# Patient Record
Sex: Female | Born: 1940 | Race: White | Hispanic: No | Marital: Married | Smoking: Never smoker
Health system: Southern US, Community
[De-identification: ages and names within clinical notes are randomized; demographics above are authoritative.]

## PROBLEM LIST (undated history)

## (undated) DIAGNOSIS — E785 Hyperlipidemia, unspecified: Secondary | ICD-10-CM

## (undated) DIAGNOSIS — M810 Age-related osteoporosis without current pathological fracture: Secondary | ICD-10-CM

## (undated) DIAGNOSIS — B449 Aspergillosis, unspecified: Secondary | ICD-10-CM

## (undated) HISTORY — DX: Age-related osteoporosis without current pathological fracture: M81.0

## (undated) HISTORY — DX: Aspergillosis, unspecified: B44.9

## (undated) HISTORY — DX: Hyperlipidemia, unspecified: E78.5

---

## 1999-07-08 ENCOUNTER — Encounter: Admission: RE | Admit: 1999-07-08 | Discharge: 1999-07-08 | Payer: Self-pay | Admitting: Internal Medicine

## 1999-07-08 ENCOUNTER — Encounter: Payer: Self-pay | Admitting: Internal Medicine

## 1999-11-01 ENCOUNTER — Other Ambulatory Visit: Admission: RE | Admit: 1999-11-01 | Discharge: 1999-11-01 | Payer: Self-pay | Admitting: Internal Medicine

## 1999-12-07 ENCOUNTER — Encounter: Admission: RE | Admit: 1999-12-07 | Discharge: 1999-12-07 | Payer: Self-pay | Admitting: Internal Medicine

## 1999-12-07 ENCOUNTER — Encounter: Payer: Self-pay | Admitting: Internal Medicine

## 2000-07-11 ENCOUNTER — Encounter: Admission: RE | Admit: 2000-07-11 | Discharge: 2000-07-11 | Payer: Self-pay | Admitting: Internal Medicine

## 2000-07-11 ENCOUNTER — Encounter: Payer: Self-pay | Admitting: Internal Medicine

## 2000-09-07 ENCOUNTER — Encounter: Admission: RE | Admit: 2000-09-07 | Discharge: 2000-09-07 | Payer: Self-pay | Admitting: Internal Medicine

## 2000-09-07 ENCOUNTER — Encounter: Payer: Self-pay | Admitting: Internal Medicine

## 2000-09-14 ENCOUNTER — Ambulatory Visit: Admission: RE | Admit: 2000-09-14 | Discharge: 2000-09-14 | Payer: Self-pay | Admitting: Internal Medicine

## 2000-09-14 ENCOUNTER — Encounter (INDEPENDENT_AMBULATORY_CARE_PROVIDER_SITE_OTHER): Payer: Self-pay

## 2001-08-21 ENCOUNTER — Encounter: Payer: Self-pay | Admitting: Internal Medicine

## 2001-08-21 ENCOUNTER — Encounter: Admission: RE | Admit: 2001-08-21 | Discharge: 2001-08-21 | Payer: Self-pay | Admitting: Internal Medicine

## 2002-09-23 ENCOUNTER — Encounter: Admission: RE | Admit: 2002-09-23 | Discharge: 2002-09-23 | Payer: Self-pay | Admitting: Internal Medicine

## 2002-09-23 ENCOUNTER — Encounter: Payer: Self-pay | Admitting: Internal Medicine

## 2003-11-05 ENCOUNTER — Encounter: Admission: RE | Admit: 2003-11-05 | Discharge: 2003-11-05 | Payer: Self-pay | Admitting: Internal Medicine

## 2004-01-26 ENCOUNTER — Other Ambulatory Visit: Admission: RE | Admit: 2004-01-26 | Discharge: 2004-01-26 | Payer: Self-pay | Admitting: Internal Medicine

## 2004-11-18 ENCOUNTER — Encounter: Admission: RE | Admit: 2004-11-18 | Discharge: 2004-11-18 | Payer: Self-pay | Admitting: Internal Medicine

## 2005-12-22 ENCOUNTER — Encounter: Admission: RE | Admit: 2005-12-22 | Discharge: 2005-12-22 | Payer: Self-pay | Admitting: Internal Medicine

## 2006-04-06 ENCOUNTER — Other Ambulatory Visit: Admission: RE | Admit: 2006-04-06 | Discharge: 2006-04-06 | Payer: Self-pay | Admitting: Internal Medicine

## 2007-01-10 ENCOUNTER — Encounter: Admission: RE | Admit: 2007-01-10 | Discharge: 2007-01-10 | Payer: Self-pay | Admitting: Internal Medicine

## 2007-03-20 ENCOUNTER — Ambulatory Visit: Payer: Self-pay | Admitting: Internal Medicine

## 2007-05-14 ENCOUNTER — Ambulatory Visit: Payer: Self-pay | Admitting: Internal Medicine

## 2008-02-14 ENCOUNTER — Encounter: Admission: RE | Admit: 2008-02-14 | Discharge: 2008-02-14 | Payer: Self-pay | Admitting: Internal Medicine

## 2008-07-28 ENCOUNTER — Other Ambulatory Visit: Admission: RE | Admit: 2008-07-28 | Discharge: 2008-07-28 | Payer: Self-pay | Admitting: Internal Medicine

## 2009-02-25 ENCOUNTER — Encounter: Admission: RE | Admit: 2009-02-25 | Discharge: 2009-02-25 | Payer: Self-pay | Admitting: Internal Medicine

## 2010-03-24 ENCOUNTER — Encounter: Admission: RE | Admit: 2010-03-24 | Discharge: 2010-03-24 | Payer: Self-pay | Admitting: Internal Medicine

## 2011-02-01 NOTE — Assessment & Plan Note (Signed)
[facility]                             PULMONARY OFFICE NOTE   Sarah Odonnell, Sarah Odonnell                       MRN:          [id number]  DATE:03/20/2007                            DOB:          1941/05/06    REASON FOR CONSULTATION:  Asthma.   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  Her previous records from 2001 were not available  for review today, but she is doing great since that time on Pulmicort  dosed one puff b.i.d. with no significant chronic cough or need for  rescue inhaler.  However, approximately two weeks ago, she began having  increasing cough with thick mucous production which was slightly yellow.  She received antibiotics with no benefit and a Cortizone shot that  seemed to help more than anything else.  It feels partially better,  but still having difficulty with thick mucous plugs, especially in the  evenings.  She denies any nocturnal exacerbation or early morning  exacerbation of cough or wheezing, fevers, chills, sweats, dyspnea or  leg swelling or chest pain of any kind.   PAST MEDICAL HISTORY:  Significant only for asthma.   ALLERGIES:  None known.   MEDICATIONS:  1. Pulmicort one puff b.i.d.  2. Mucinex p.r.n., never more than twice daily.   SOCIAL HISTORY:  She has never smoked, and is retired.   FAMILY HISTORY:  Recorded in detail, significant for allergies  everyone.   REVIEW OF SYSTEMS:  Taken in detail in the worksheet, negative except as  outlined above.   PHYSICAL EXAMINATION:  GENERAL:  This is a delightful, thin, ambulatory  white female in no acute distress.  VITAL SIGNS:  Stable.  HEENT:  Unremarkable.  Pharynx is clear.  NECK:  Supple without cervical adenopathy or tenderness.  Trachea is  midline.  LUNGS:   Lung fields revealed completely clear bilaterally to  auscultation and percussion with no wheezing on expiration.  HEART:  Regular rhythm without murmurs, gallops, rubs.  ABDOMEN:  Soft, benign.  EXTREMITIES:  Warm without calf tenderness, cyanosis, clubbing or edema.   STUDIES:  Chest x-ray is pending.   IMPRESSION:  Persistent cough following what amounts to an apparent URI  with an asthma exacerbation.  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   The old thinking on ABPA is that it is a steroid dependent disease,  but in the era of very potent and effective inhaled steroids, I do not  necessarily feel these type of patients need to be prolonged systemic  steroids, but she would benefit from a more aggressive contingency plan.  In the event of exacerbations, the benefit of Pulmicort is that she does  not need to change inhalers but simply can increase the Pulmicort up to  four puffs b.i.d. at the fist sign of any exacerbation and then taper  back down using the squirt gun could put out a fire analogy, as long as  it is used early and aimed correctly.     Charlaine Dalton. Sherene Sires, MD, [facility]  Electronically Signed    MBW/MedQ  DD: 03/20/2007  DT: 03/21/2007  Job #: [id number]   cc:   Theressa Millard, M.D.

## 2011-02-01 NOTE — Assessment & Plan Note (Signed)
[facility]                             PULMONARY OFFICE NOTE   NAME:Sarah Odonnell, Sarah Odonnell                       MRN:          [id number]  DATE:05/14/2007                            DOB:          02/07/1941    HISTORY:  Delightful 70 year old white female, never smoker, with  working diagnosis of ABPA made in 2001 and doing great until she worked  in the yard too much this summer and came in with an acute cough on  March 20, 2007 which seemed to respond very nicely to a short course of  prednisone and a boost of Pulmicort up to 4 puffs b.i.d.  She is back  down to 1 puff b.i.d. now with no symptoms of cough or dyspnea at all  but has not gone back out in the yard.   PHYSICAL EXAMINATION:  GENERAL:  She is a pleasant ambulatory white  female in no acute distress.  VITAL SIGNS:  Stable.  LUNGS:  Lung fields are perfectly clear bilaterally to auscultation and  percussion.  CARDIAC:  Regular rate and rhythm without murmurs, gallop, or rub or  increase in P2.  ABDOMEN:  Soft, benign.  EXTREMITIES:  Without calf tenderness, cyanosis, clubbing.   Chest x-ray reviewed from March 20, 2007 is normal.  PFTs were performed  today and are also normal.   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  I strongly suspect the reason for this is that Pulmicort, when  used consistently as she does, is giving her all the effect on the  airways as low-doses of prednisone would and therefore recommend that  she stay on a floor of 1 puff b.i.d. indefinitely but have a very low  threshold to increase to 4 puffs b.i.d. regardless of the mechanism of  any flare up of coughing or dyspnea.   She agreed to this, and I will be happy to see her back here on a p.r.n.  but do not believe any further regular  pulmonary followup is needed.     Charlaine Dalton. Sherene Sires, MD, [facility]  Electronically Signed    MBW/MedQ  DD: 05/14/2007  DT: 05/15/2007  Job #: [id number]   cc:   Theressa Millard, M.D.

## 2011-02-04 NOTE — Procedures (Signed)
[facility]  Patient:    Sarah Odonnell, Sarah Odonnell                      MRN: [id number] Proc. Date: 09/14/00 Attending:  Charlaine Dalton. Sherene Sires, M.D. [facility]                           Procedure Report  PROCEDURE:  Bronchoscopy with endobronchial biopsy of the right upper lobe.  INDICATIONS:  This is a very nice 70 year old white female, with persistent cough since August 2001 and evidence of right upper lobe atelectasis on recent workup by Dr. Earl Gala.  Bronchoscopy was felt to be indicated to explore the orifice of the right upper lobe to see if there was a clear etiology for right upper lobe atelectasis, such as right upper lobe obstruction with tumor or foreign body.  For a full consultation note, please see the dictated records from the office of September 11, 2000.  The patient agrees to the procedure after a full discussion of the risks, benefits and alternatives at that time.  The procedure was performed in the bronchoscopy suite, with continuous monitoring by surface ECG and oximetry.  The patient maintained greater than 90% saturation throughout the procedure, and sinus rhythm.  She required a total of 50 mg IV Demerol and 5 mg of IV Versed for adequate sedation.  DESCRIPTION OF PROCEDURE AND FINDINGS:  The right nares and oropharynx were infiltrated with 10% lidocaine spray.  The right nares then prepared with 2% lidocaine jelly.  Using a standard flexible fiberoptic bronchoscope, the right nares was easily cannulated; good visualization of the entire oropharynx and larynx.  The cords moved normally and there were no apparent upper airway lesions.  Using additional 1% lidocaine, the entire tracheobronchial tubes were explored bilaterally, with these findings: 1. The trachea, carina and all the left-sided airways were normal. 2. The right-sided airways were normal below the takeoff of the right upper    lobe. 3. The right upper lobe was markedly abnormal as  follows:    a. There was tenacious, ropey mucus obstructing the right upper lobe, with       apparent concentric narrowing of the apical and posterior segments.  I       could not appreciate a definite endobronchial mass.  However, because       of the tenacious nature of the secretions I was not able to completely       lavage the upper lobe free of debris.  I did biopsy the orifice of the       right upper lobe four times, with adequate tissue obtained.  The       anterior segment of the right upper lobe appeared to open widely.    b. In addition, the right upper lobe was vigorously lavaged.  ____________________________ DD:  09/14/00 TD:  09/14/00 Job: [id number]

## 2011-03-29 ENCOUNTER — Other Ambulatory Visit: Payer: Self-pay | Admitting: Internal Medicine

## 2011-03-29 DIAGNOSIS — Z1231 Encounter for screening mammogram for malignant neoplasm of breast: Secondary | ICD-10-CM

## 2011-04-20 ENCOUNTER — Ambulatory Visit: Payer: Self-pay

## 2011-04-27 ENCOUNTER — Ambulatory Visit
Admission: RE | Admit: 2011-04-27 | Discharge: 2011-04-27 | Disposition: A | Discharge status: Home / Self Care | Payer: Medicare Other | Source: Ambulatory Visit | Attending: Internal Medicine | Admitting: Internal Medicine

## 2011-04-27 DIAGNOSIS — Z1231 Encounter for screening mammogram for malignant neoplasm of breast: Secondary | ICD-10-CM

## 2012-05-30 ENCOUNTER — Other Ambulatory Visit: Payer: Self-pay | Admitting: Internal Medicine

## 2012-05-30 DIAGNOSIS — Z1231 Encounter for screening mammogram for malignant neoplasm of breast: Secondary | ICD-10-CM

## 2012-07-25 ENCOUNTER — Ambulatory Visit
Admission: RE | Admit: 2012-07-25 | Discharge: 2012-07-25 | Disposition: A | Discharge status: Home / Self Care | Payer: Medicare Other | Source: Ambulatory Visit | Attending: Internal Medicine | Admitting: Internal Medicine

## 2012-07-25 DIAGNOSIS — Z1231 Encounter for screening mammogram for malignant neoplasm of breast: Secondary | ICD-10-CM

## 2013-03-19 ENCOUNTER — Other Ambulatory Visit: Payer: Self-pay | Admitting: Dermatology

## 2013-08-12 ENCOUNTER — Other Ambulatory Visit: Payer: Self-pay

## 2013-08-12 DIAGNOSIS — Z1231 Encounter for screening mammogram for malignant neoplasm of breast: Secondary | ICD-10-CM

## 2013-08-21 ENCOUNTER — Ambulatory Visit: Payer: Medicare Other

## 2013-10-01 ENCOUNTER — Ambulatory Visit: Payer: Medicare Other

## 2013-10-07 ENCOUNTER — Ambulatory Visit
Admission: RE | Admit: 2013-10-07 | Discharge: 2013-10-07 | Disposition: A | Discharge status: Home / Self Care | Payer: Medicare HMO | Source: Ambulatory Visit

## 2013-10-07 DIAGNOSIS — Z1231 Encounter for screening mammogram for malignant neoplasm of breast: Secondary | ICD-10-CM

## 2014-02-06 ENCOUNTER — Other Ambulatory Visit: Payer: Self-pay | Admitting: Internal Medicine

## 2014-02-06 DIAGNOSIS — M25469 Effusion, unspecified knee: Secondary | ICD-10-CM

## 2014-02-12 ENCOUNTER — Ambulatory Visit
Admission: RE | Admit: 2014-02-12 | Discharge: 2014-02-12 | Disposition: A | Discharge status: Home / Self Care | Payer: Medicare HMO | Source: Ambulatory Visit | Attending: Internal Medicine | Admitting: Internal Medicine

## 2014-02-12 DIAGNOSIS — M25469 Effusion, unspecified knee: Secondary | ICD-10-CM

## 2014-10-30 ENCOUNTER — Other Ambulatory Visit: Payer: Self-pay

## 2014-10-30 DIAGNOSIS — Z1231 Encounter for screening mammogram for malignant neoplasm of breast: Secondary | ICD-10-CM

## 2014-11-19 ENCOUNTER — Ambulatory Visit
Admission: RE | Admit: 2014-11-19 | Discharge: 2014-11-19 | Disposition: A | Discharge status: Home / Self Care | Payer: Medicare Other | Source: Ambulatory Visit

## 2014-11-19 DIAGNOSIS — Z1231 Encounter for screening mammogram for malignant neoplasm of breast: Secondary | ICD-10-CM

## 2015-05-28 ENCOUNTER — Other Ambulatory Visit: Payer: Self-pay | Admitting: Physician Assistant

## 2015-11-20 ENCOUNTER — Other Ambulatory Visit: Payer: Self-pay

## 2015-11-20 DIAGNOSIS — Z1231 Encounter for screening mammogram for malignant neoplasm of breast: Secondary | ICD-10-CM

## 2015-12-02 ENCOUNTER — Ambulatory Visit
Admission: RE | Admit: 2015-12-02 | Discharge: 2015-12-02 | Disposition: A | Discharge status: Home / Self Care | Payer: Medicare HMO | Source: Ambulatory Visit

## 2015-12-02 DIAGNOSIS — Z1231 Encounter for screening mammogram for malignant neoplasm of breast: Secondary | ICD-10-CM

## 2016-09-19 HISTORY — PX: CATARACT EXTRACTION: SUR2

## 2016-10-21 DIAGNOSIS — M67912 Unspecified disorder of synovium and tendon, left shoulder: Secondary | ICD-10-CM | POA: Diagnosis not present

## 2016-10-27 DIAGNOSIS — M25512 Pain in left shoulder: Secondary | ICD-10-CM | POA: Diagnosis not present

## 2016-11-01 DIAGNOSIS — M25512 Pain in left shoulder: Secondary | ICD-10-CM | POA: Diagnosis not present

## 2016-11-03 DIAGNOSIS — M25512 Pain in left shoulder: Secondary | ICD-10-CM | POA: Diagnosis not present

## 2016-11-08 DIAGNOSIS — M25512 Pain in left shoulder: Secondary | ICD-10-CM | POA: Diagnosis not present

## 2016-11-09 DIAGNOSIS — H2511 Age-related nuclear cataract, right eye: Secondary | ICD-10-CM | POA: Diagnosis not present

## 2016-11-09 DIAGNOSIS — H5203 Hypermetropia, bilateral: Secondary | ICD-10-CM | POA: Diagnosis not present

## 2016-11-09 DIAGNOSIS — H25012 Cortical age-related cataract, left eye: Secondary | ICD-10-CM | POA: Diagnosis not present

## 2016-11-10 DIAGNOSIS — M25512 Pain in left shoulder: Secondary | ICD-10-CM | POA: Diagnosis not present

## 2016-11-15 DIAGNOSIS — M25512 Pain in left shoulder: Secondary | ICD-10-CM | POA: Diagnosis not present

## 2016-11-16 DIAGNOSIS — M75122 Complete rotator cuff tear or rupture of left shoulder, not specified as traumatic: Secondary | ICD-10-CM | POA: Diagnosis not present

## 2016-11-16 DIAGNOSIS — M25512 Pain in left shoulder: Secondary | ICD-10-CM | POA: Diagnosis not present

## 2016-11-17 DIAGNOSIS — M25512 Pain in left shoulder: Secondary | ICD-10-CM | POA: Diagnosis not present

## 2016-11-22 DIAGNOSIS — M25512 Pain in left shoulder: Secondary | ICD-10-CM | POA: Diagnosis not present

## 2016-11-25 DIAGNOSIS — M75122 Complete rotator cuff tear or rupture of left shoulder, not specified as traumatic: Secondary | ICD-10-CM | POA: Diagnosis not present

## 2016-11-25 DIAGNOSIS — M25512 Pain in left shoulder: Secondary | ICD-10-CM | POA: Diagnosis not present

## 2016-12-26 DIAGNOSIS — M7542 Impingement syndrome of left shoulder: Secondary | ICD-10-CM | POA: Diagnosis not present

## 2016-12-26 DIAGNOSIS — M94212 Chondromalacia, left shoulder: Secondary | ICD-10-CM | POA: Diagnosis not present

## 2016-12-26 DIAGNOSIS — M19012 Primary osteoarthritis, left shoulder: Secondary | ICD-10-CM | POA: Diagnosis not present

## 2016-12-26 DIAGNOSIS — M75112 Incomplete rotator cuff tear or rupture of left shoulder, not specified as traumatic: Secondary | ICD-10-CM | POA: Diagnosis not present

## 2016-12-26 DIAGNOSIS — G8918 Other acute postprocedural pain: Secondary | ICD-10-CM | POA: Diagnosis not present

## 2016-12-26 DIAGNOSIS — M7502 Adhesive capsulitis of left shoulder: Secondary | ICD-10-CM | POA: Diagnosis not present

## 2016-12-28 DIAGNOSIS — M25512 Pain in left shoulder: Secondary | ICD-10-CM | POA: Diagnosis not present

## 2016-12-28 DIAGNOSIS — Z9889 Other specified postprocedural states: Secondary | ICD-10-CM | POA: Diagnosis not present

## 2016-12-28 DIAGNOSIS — M25612 Stiffness of left shoulder, not elsewhere classified: Secondary | ICD-10-CM | POA: Diagnosis not present

## 2016-12-30 DIAGNOSIS — M25612 Stiffness of left shoulder, not elsewhere classified: Secondary | ICD-10-CM | POA: Diagnosis not present

## 2016-12-30 DIAGNOSIS — Z9889 Other specified postprocedural states: Secondary | ICD-10-CM | POA: Diagnosis not present

## 2016-12-30 DIAGNOSIS — M25512 Pain in left shoulder: Secondary | ICD-10-CM | POA: Diagnosis not present

## 2017-01-04 DIAGNOSIS — M25612 Stiffness of left shoulder, not elsewhere classified: Secondary | ICD-10-CM | POA: Diagnosis not present

## 2017-01-04 DIAGNOSIS — Z0000 Encounter for general adult medical examination without abnormal findings: Secondary | ICD-10-CM | POA: Diagnosis not present

## 2017-01-04 DIAGNOSIS — M25512 Pain in left shoulder: Secondary | ICD-10-CM | POA: Diagnosis not present

## 2017-01-04 DIAGNOSIS — Z9889 Other specified postprocedural states: Secondary | ICD-10-CM | POA: Diagnosis not present

## 2017-01-04 DIAGNOSIS — M810 Age-related osteoporosis without current pathological fracture: Secondary | ICD-10-CM | POA: Diagnosis not present

## 2017-01-05 DIAGNOSIS — Z9889 Other specified postprocedural states: Secondary | ICD-10-CM | POA: Diagnosis not present

## 2017-01-05 DIAGNOSIS — M25612 Stiffness of left shoulder, not elsewhere classified: Secondary | ICD-10-CM | POA: Diagnosis not present

## 2017-01-05 DIAGNOSIS — R946 Abnormal results of thyroid function studies: Secondary | ICD-10-CM | POA: Diagnosis not present

## 2017-01-05 DIAGNOSIS — M25512 Pain in left shoulder: Secondary | ICD-10-CM | POA: Diagnosis not present

## 2017-01-09 DIAGNOSIS — M25612 Stiffness of left shoulder, not elsewhere classified: Secondary | ICD-10-CM | POA: Diagnosis not present

## 2017-01-09 DIAGNOSIS — M25512 Pain in left shoulder: Secondary | ICD-10-CM | POA: Diagnosis not present

## 2017-01-09 DIAGNOSIS — Z9889 Other specified postprocedural states: Secondary | ICD-10-CM | POA: Diagnosis not present

## 2017-01-11 DIAGNOSIS — M810 Age-related osteoporosis without current pathological fracture: Secondary | ICD-10-CM | POA: Diagnosis not present

## 2017-01-11 DIAGNOSIS — Z1389 Encounter for screening for other disorder: Secondary | ICD-10-CM | POA: Diagnosis not present

## 2017-01-11 DIAGNOSIS — Z681 Body mass index (BMI) 19 or less, adult: Secondary | ICD-10-CM | POA: Diagnosis not present

## 2017-01-11 DIAGNOSIS — Z0000 Encounter for general adult medical examination without abnormal findings: Secondary | ICD-10-CM | POA: Diagnosis not present

## 2017-01-12 DIAGNOSIS — M25512 Pain in left shoulder: Secondary | ICD-10-CM | POA: Diagnosis not present

## 2017-01-12 DIAGNOSIS — Z9889 Other specified postprocedural states: Secondary | ICD-10-CM | POA: Diagnosis not present

## 2017-01-12 DIAGNOSIS — M25612 Stiffness of left shoulder, not elsewhere classified: Secondary | ICD-10-CM | POA: Diagnosis not present

## 2017-01-16 DIAGNOSIS — M25512 Pain in left shoulder: Secondary | ICD-10-CM | POA: Diagnosis not present

## 2017-01-16 DIAGNOSIS — M25612 Stiffness of left shoulder, not elsewhere classified: Secondary | ICD-10-CM | POA: Diagnosis not present

## 2017-01-16 DIAGNOSIS — Z9889 Other specified postprocedural states: Secondary | ICD-10-CM | POA: Diagnosis not present

## 2017-01-19 DIAGNOSIS — Z9889 Other specified postprocedural states: Secondary | ICD-10-CM | POA: Diagnosis not present

## 2017-01-19 DIAGNOSIS — M25612 Stiffness of left shoulder, not elsewhere classified: Secondary | ICD-10-CM | POA: Diagnosis not present

## 2017-01-19 DIAGNOSIS — M25512 Pain in left shoulder: Secondary | ICD-10-CM | POA: Diagnosis not present

## 2017-01-20 DIAGNOSIS — Z9889 Other specified postprocedural states: Secondary | ICD-10-CM | POA: Diagnosis not present

## 2017-01-27 DIAGNOSIS — Z9889 Other specified postprocedural states: Secondary | ICD-10-CM | POA: Diagnosis not present

## 2017-01-27 DIAGNOSIS — M25612 Stiffness of left shoulder, not elsewhere classified: Secondary | ICD-10-CM | POA: Diagnosis not present

## 2017-01-27 DIAGNOSIS — M25512 Pain in left shoulder: Secondary | ICD-10-CM | POA: Diagnosis not present

## 2017-01-31 DIAGNOSIS — M25512 Pain in left shoulder: Secondary | ICD-10-CM | POA: Diagnosis not present

## 2017-01-31 DIAGNOSIS — Z9889 Other specified postprocedural states: Secondary | ICD-10-CM | POA: Diagnosis not present

## 2017-01-31 DIAGNOSIS — M25612 Stiffness of left shoulder, not elsewhere classified: Secondary | ICD-10-CM | POA: Diagnosis not present

## 2017-02-02 DIAGNOSIS — M25612 Stiffness of left shoulder, not elsewhere classified: Secondary | ICD-10-CM | POA: Diagnosis not present

## 2017-02-02 DIAGNOSIS — Z9889 Other specified postprocedural states: Secondary | ICD-10-CM | POA: Diagnosis not present

## 2017-02-02 DIAGNOSIS — M25512 Pain in left shoulder: Secondary | ICD-10-CM | POA: Diagnosis not present

## 2017-02-07 DIAGNOSIS — Z9889 Other specified postprocedural states: Secondary | ICD-10-CM | POA: Diagnosis not present

## 2017-02-07 DIAGNOSIS — M25512 Pain in left shoulder: Secondary | ICD-10-CM | POA: Diagnosis not present

## 2017-02-07 DIAGNOSIS — M25612 Stiffness of left shoulder, not elsewhere classified: Secondary | ICD-10-CM | POA: Diagnosis not present

## 2017-02-09 DIAGNOSIS — Z9889 Other specified postprocedural states: Secondary | ICD-10-CM | POA: Diagnosis not present

## 2017-02-09 DIAGNOSIS — M25512 Pain in left shoulder: Secondary | ICD-10-CM | POA: Diagnosis not present

## 2017-02-09 DIAGNOSIS — M25612 Stiffness of left shoulder, not elsewhere classified: Secondary | ICD-10-CM | POA: Diagnosis not present

## 2017-02-15 DIAGNOSIS — M25612 Stiffness of left shoulder, not elsewhere classified: Secondary | ICD-10-CM | POA: Diagnosis not present

## 2017-02-15 DIAGNOSIS — M25512 Pain in left shoulder: Secondary | ICD-10-CM | POA: Diagnosis not present

## 2017-02-15 DIAGNOSIS — Z9889 Other specified postprocedural states: Secondary | ICD-10-CM | POA: Diagnosis not present

## 2017-02-17 DIAGNOSIS — M25512 Pain in left shoulder: Secondary | ICD-10-CM | POA: Diagnosis not present

## 2017-02-17 DIAGNOSIS — Z9889 Other specified postprocedural states: Secondary | ICD-10-CM | POA: Diagnosis not present

## 2017-02-17 DIAGNOSIS — M25612 Stiffness of left shoulder, not elsewhere classified: Secondary | ICD-10-CM | POA: Diagnosis not present

## 2017-03-02 DIAGNOSIS — Z23 Encounter for immunization: Secondary | ICD-10-CM | POA: Diagnosis not present

## 2017-03-03 DIAGNOSIS — Z9889 Other specified postprocedural states: Secondary | ICD-10-CM | POA: Diagnosis not present

## 2017-03-08 DIAGNOSIS — M19072 Primary osteoarthritis, left ankle and foot: Secondary | ICD-10-CM | POA: Diagnosis not present

## 2017-03-23 DIAGNOSIS — H25012 Cortical age-related cataract, left eye: Secondary | ICD-10-CM | POA: Diagnosis not present

## 2017-03-23 DIAGNOSIS — H2513 Age-related nuclear cataract, bilateral: Secondary | ICD-10-CM | POA: Diagnosis not present

## 2017-04-11 DIAGNOSIS — H25811 Combined forms of age-related cataract, right eye: Secondary | ICD-10-CM | POA: Diagnosis not present

## 2017-04-11 DIAGNOSIS — H2511 Age-related nuclear cataract, right eye: Secondary | ICD-10-CM | POA: Diagnosis not present

## 2017-05-23 DIAGNOSIS — H25012 Cortical age-related cataract, left eye: Secondary | ICD-10-CM | POA: Diagnosis not present

## 2017-05-23 DIAGNOSIS — H2512 Age-related nuclear cataract, left eye: Secondary | ICD-10-CM | POA: Diagnosis not present

## 2017-05-23 DIAGNOSIS — H25812 Combined forms of age-related cataract, left eye: Secondary | ICD-10-CM | POA: Diagnosis not present

## 2017-08-03 ENCOUNTER — Other Ambulatory Visit: Payer: Self-pay | Admitting: Internal Medicine

## 2017-08-03 DIAGNOSIS — Z139 Encounter for screening, unspecified: Secondary | ICD-10-CM

## 2017-08-24 DIAGNOSIS — H04222 Epiphora due to insufficient drainage, left lacrimal gland: Secondary | ICD-10-CM | POA: Diagnosis not present

## 2017-08-24 DIAGNOSIS — H04221 Epiphora due to insufficient drainage, right lacrimal gland: Secondary | ICD-10-CM | POA: Diagnosis not present

## 2017-09-07 ENCOUNTER — Ambulatory Visit
Admission: RE | Admit: 2017-09-07 | Discharge: 2017-09-07 | Disposition: A | Discharge status: Home / Self Care | Payer: PPO | Source: Ambulatory Visit | Attending: Internal Medicine | Admitting: Internal Medicine

## 2017-09-07 DIAGNOSIS — H04223 Epiphora due to insufficient drainage, bilateral lacrimal glands: Secondary | ICD-10-CM | POA: Diagnosis not present

## 2017-09-07 DIAGNOSIS — H04552 Acquired stenosis of left nasolacrimal duct: Secondary | ICD-10-CM | POA: Diagnosis not present

## 2017-09-07 DIAGNOSIS — Z139 Encounter for screening, unspecified: Secondary | ICD-10-CM

## 2017-09-07 DIAGNOSIS — H04411 Chronic dacryocystitis of right lacrimal passage: Secondary | ICD-10-CM | POA: Diagnosis not present

## 2017-09-07 DIAGNOSIS — H04221 Epiphora due to insufficient drainage, right lacrimal gland: Secondary | ICD-10-CM | POA: Diagnosis not present

## 2017-09-07 DIAGNOSIS — H04412 Chronic dacryocystitis of left lacrimal passage: Secondary | ICD-10-CM | POA: Diagnosis not present

## 2017-09-07 DIAGNOSIS — Z1231 Encounter for screening mammogram for malignant neoplasm of breast: Secondary | ICD-10-CM | POA: Diagnosis not present

## 2017-09-07 DIAGNOSIS — H04123 Dry eye syndrome of bilateral lacrimal glands: Secondary | ICD-10-CM | POA: Diagnosis not present

## 2017-09-07 DIAGNOSIS — H04222 Epiphora due to insufficient drainage, left lacrimal gland: Secondary | ICD-10-CM | POA: Diagnosis not present

## 2017-09-07 DIAGNOSIS — H02132 Senile ectropion of right lower eyelid: Secondary | ICD-10-CM | POA: Diagnosis not present

## 2017-09-07 DIAGNOSIS — H02135 Senile ectropion of left lower eyelid: Secondary | ICD-10-CM | POA: Diagnosis not present

## 2017-09-07 DIAGNOSIS — H04551 Acquired stenosis of right nasolacrimal duct: Secondary | ICD-10-CM | POA: Diagnosis not present

## 2017-09-21 DIAGNOSIS — H04123 Dry eye syndrome of bilateral lacrimal glands: Secondary | ICD-10-CM | POA: Diagnosis not present

## 2017-09-21 DIAGNOSIS — H04552 Acquired stenosis of left nasolacrimal duct: Secondary | ICD-10-CM | POA: Diagnosis not present

## 2017-09-21 DIAGNOSIS — H02132 Senile ectropion of right lower eyelid: Secondary | ICD-10-CM | POA: Diagnosis not present

## 2017-09-21 DIAGNOSIS — H04223 Epiphora due to insufficient drainage, bilateral lacrimal glands: Secondary | ICD-10-CM | POA: Diagnosis not present

## 2017-09-21 DIAGNOSIS — H04551 Acquired stenosis of right nasolacrimal duct: Secondary | ICD-10-CM | POA: Diagnosis not present

## 2017-09-21 DIAGNOSIS — H04221 Epiphora due to insufficient drainage, right lacrimal gland: Secondary | ICD-10-CM | POA: Diagnosis not present

## 2017-09-21 DIAGNOSIS — H02135 Senile ectropion of left lower eyelid: Secondary | ICD-10-CM | POA: Diagnosis not present

## 2017-09-21 DIAGNOSIS — H00029 Hordeolum internum unspecified eye, unspecified eyelid: Secondary | ICD-10-CM | POA: Diagnosis not present

## 2017-09-21 DIAGNOSIS — H04222 Epiphora due to insufficient drainage, left lacrimal gland: Secondary | ICD-10-CM | POA: Diagnosis not present

## 2017-09-29 DIAGNOSIS — M2022 Hallux rigidus, left foot: Secondary | ICD-10-CM | POA: Diagnosis not present

## 2017-09-29 DIAGNOSIS — M79672 Pain in left foot: Secondary | ICD-10-CM | POA: Diagnosis not present

## 2017-10-05 DIAGNOSIS — H04222 Epiphora due to insufficient drainage, left lacrimal gland: Secondary | ICD-10-CM | POA: Diagnosis not present

## 2018-01-09 DIAGNOSIS — R946 Abnormal results of thyroid function studies: Secondary | ICD-10-CM | POA: Diagnosis not present

## 2018-01-09 DIAGNOSIS — Z79899 Other long term (current) drug therapy: Secondary | ICD-10-CM | POA: Diagnosis not present

## 2018-01-09 DIAGNOSIS — M810 Age-related osteoporosis without current pathological fracture: Secondary | ICD-10-CM | POA: Diagnosis not present

## 2018-01-09 DIAGNOSIS — R82998 Other abnormal findings in urine: Secondary | ICD-10-CM | POA: Diagnosis not present

## 2018-01-15 DIAGNOSIS — Z1389 Encounter for screening for other disorder: Secondary | ICD-10-CM | POA: Diagnosis not present

## 2018-01-15 DIAGNOSIS — B4489 Other forms of aspergillosis: Secondary | ICD-10-CM | POA: Diagnosis not present

## 2018-01-15 DIAGNOSIS — M810 Age-related osteoporosis without current pathological fracture: Secondary | ICD-10-CM | POA: Diagnosis not present

## 2018-01-15 DIAGNOSIS — Z681 Body mass index (BMI) 19 or less, adult: Secondary | ICD-10-CM | POA: Diagnosis not present

## 2018-01-15 DIAGNOSIS — Z0000 Encounter for general adult medical examination without abnormal findings: Secondary | ICD-10-CM | POA: Diagnosis not present

## 2018-01-15 DIAGNOSIS — R030 Elevated blood-pressure reading, without diagnosis of hypertension: Secondary | ICD-10-CM | POA: Diagnosis not present

## 2018-01-15 DIAGNOSIS — E7849 Other hyperlipidemia: Secondary | ICD-10-CM | POA: Diagnosis not present

## 2018-01-19 DIAGNOSIS — Z1212 Encounter for screening for malignant neoplasm of rectum: Secondary | ICD-10-CM | POA: Diagnosis not present

## 2018-10-19 ENCOUNTER — Other Ambulatory Visit: Payer: Self-pay | Admitting: Internal Medicine

## 2018-10-19 DIAGNOSIS — Z1231 Encounter for screening mammogram for malignant neoplasm of breast: Secondary | ICD-10-CM

## 2018-11-15 ENCOUNTER — Ambulatory Visit
Admission: RE | Admit: 2018-11-15 | Discharge: 2018-11-15 | Disposition: A | Discharge status: Home / Self Care | Payer: PPO | Source: Ambulatory Visit | Attending: Internal Medicine | Admitting: Internal Medicine

## 2018-11-15 DIAGNOSIS — Z1231 Encounter for screening mammogram for malignant neoplasm of breast: Secondary | ICD-10-CM

## 2018-11-23 DIAGNOSIS — M67911 Unspecified disorder of synovium and tendon, right shoulder: Secondary | ICD-10-CM | POA: Diagnosis not present

## 2018-11-27 DIAGNOSIS — M7541 Impingement syndrome of right shoulder: Secondary | ICD-10-CM | POA: Diagnosis not present

## 2018-11-27 DIAGNOSIS — M6281 Muscle weakness (generalized): Secondary | ICD-10-CM | POA: Diagnosis not present

## 2018-11-28 DIAGNOSIS — M7541 Impingement syndrome of right shoulder: Secondary | ICD-10-CM | POA: Diagnosis not present

## 2018-11-28 DIAGNOSIS — M6281 Muscle weakness (generalized): Secondary | ICD-10-CM | POA: Diagnosis not present

## 2018-12-04 DIAGNOSIS — M7541 Impingement syndrome of right shoulder: Secondary | ICD-10-CM | POA: Diagnosis not present

## 2018-12-04 DIAGNOSIS — M6281 Muscle weakness (generalized): Secondary | ICD-10-CM | POA: Diagnosis not present

## 2018-12-11 DIAGNOSIS — M7541 Impingement syndrome of right shoulder: Secondary | ICD-10-CM | POA: Diagnosis not present

## 2018-12-11 DIAGNOSIS — M6281 Muscle weakness (generalized): Secondary | ICD-10-CM | POA: Diagnosis not present

## 2018-12-13 DIAGNOSIS — M6281 Muscle weakness (generalized): Secondary | ICD-10-CM | POA: Diagnosis not present

## 2018-12-13 DIAGNOSIS — M7541 Impingement syndrome of right shoulder: Secondary | ICD-10-CM | POA: Diagnosis not present

## 2018-12-18 DIAGNOSIS — M6281 Muscle weakness (generalized): Secondary | ICD-10-CM | POA: Diagnosis not present

## 2018-12-18 DIAGNOSIS — M7541 Impingement syndrome of right shoulder: Secondary | ICD-10-CM | POA: Diagnosis not present

## 2018-12-21 DIAGNOSIS — M6281 Muscle weakness (generalized): Secondary | ICD-10-CM | POA: Diagnosis not present

## 2019-01-01 DIAGNOSIS — M6281 Muscle weakness (generalized): Secondary | ICD-10-CM | POA: Diagnosis not present

## 2019-01-01 DIAGNOSIS — M7541 Impingement syndrome of right shoulder: Secondary | ICD-10-CM | POA: Diagnosis not present

## 2019-01-03 DIAGNOSIS — M6281 Muscle weakness (generalized): Secondary | ICD-10-CM | POA: Diagnosis not present

## 2019-01-03 DIAGNOSIS — M7541 Impingement syndrome of right shoulder: Secondary | ICD-10-CM | POA: Diagnosis not present

## 2019-01-07 DIAGNOSIS — M7541 Impingement syndrome of right shoulder: Secondary | ICD-10-CM | POA: Diagnosis not present

## 2019-01-07 DIAGNOSIS — M6281 Muscle weakness (generalized): Secondary | ICD-10-CM | POA: Diagnosis not present

## 2019-01-10 DIAGNOSIS — M810 Age-related osteoporosis without current pathological fracture: Secondary | ICD-10-CM | POA: Diagnosis not present

## 2019-01-10 DIAGNOSIS — E7849 Other hyperlipidemia: Secondary | ICD-10-CM | POA: Diagnosis not present

## 2019-01-10 DIAGNOSIS — R946 Abnormal results of thyroid function studies: Secondary | ICD-10-CM | POA: Diagnosis not present

## 2019-01-10 DIAGNOSIS — M7541 Impingement syndrome of right shoulder: Secondary | ICD-10-CM | POA: Diagnosis not present

## 2019-01-10 DIAGNOSIS — M6281 Muscle weakness (generalized): Secondary | ICD-10-CM | POA: Diagnosis not present

## 2019-01-15 DIAGNOSIS — M6281 Muscle weakness (generalized): Secondary | ICD-10-CM | POA: Diagnosis not present

## 2019-01-15 DIAGNOSIS — M7541 Impingement syndrome of right shoulder: Secondary | ICD-10-CM | POA: Diagnosis not present

## 2019-01-17 DIAGNOSIS — M7541 Impingement syndrome of right shoulder: Secondary | ICD-10-CM | POA: Diagnosis not present

## 2019-01-17 DIAGNOSIS — M810 Age-related osteoporosis without current pathological fracture: Secondary | ICD-10-CM | POA: Diagnosis not present

## 2019-01-17 DIAGNOSIS — M6281 Muscle weakness (generalized): Secondary | ICD-10-CM | POA: Diagnosis not present

## 2019-01-17 DIAGNOSIS — Z1331 Encounter for screening for depression: Secondary | ICD-10-CM | POA: Diagnosis not present

## 2019-01-17 DIAGNOSIS — E785 Hyperlipidemia, unspecified: Secondary | ICD-10-CM | POA: Diagnosis not present

## 2019-01-17 DIAGNOSIS — Z0000 Encounter for general adult medical examination without abnormal findings: Secondary | ICD-10-CM | POA: Diagnosis not present

## 2019-01-22 DIAGNOSIS — M6281 Muscle weakness (generalized): Secondary | ICD-10-CM | POA: Diagnosis not present

## 2019-01-22 DIAGNOSIS — M7541 Impingement syndrome of right shoulder: Secondary | ICD-10-CM | POA: Diagnosis not present

## 2019-01-24 DIAGNOSIS — M6281 Muscle weakness (generalized): Secondary | ICD-10-CM | POA: Diagnosis not present

## 2019-01-24 DIAGNOSIS — M7541 Impingement syndrome of right shoulder: Secondary | ICD-10-CM | POA: Diagnosis not present

## 2019-01-29 DIAGNOSIS — M7541 Impingement syndrome of right shoulder: Secondary | ICD-10-CM | POA: Diagnosis not present

## 2019-01-29 DIAGNOSIS — M6281 Muscle weakness (generalized): Secondary | ICD-10-CM | POA: Diagnosis not present

## 2019-01-31 DIAGNOSIS — M7541 Impingement syndrome of right shoulder: Secondary | ICD-10-CM | POA: Diagnosis not present

## 2019-01-31 DIAGNOSIS — M6281 Muscle weakness (generalized): Secondary | ICD-10-CM | POA: Diagnosis not present

## 2019-05-16 DIAGNOSIS — Z961 Presence of intraocular lens: Secondary | ICD-10-CM | POA: Diagnosis not present

## 2019-05-16 DIAGNOSIS — H04222 Epiphora due to insufficient drainage, left lacrimal gland: Secondary | ICD-10-CM | POA: Diagnosis not present

## 2019-10-11 ENCOUNTER — Ambulatory Visit: Payer: PPO | Attending: Internal Medicine

## 2019-10-11 DIAGNOSIS — Z23 Encounter for immunization: Secondary | ICD-10-CM | POA: Insufficient documentation

## 2019-10-11 NOTE — Progress Notes (Signed)
   Covid-19 Vaccination Clinic  Name:  Sarah Odonnell    MRN: [id number] DOB: Sep 17, 1941  10/11/2019  Ms. Sarah Odonnell was observed post Covid-19 immunization for 30 minutes based on pre-vaccination screening without incidence. She was provided with Vaccine Information Sheet and instruction to access the V-Safe system.   Ms. Sarah Odonnell was instructed to call 911 with any severe reactions post vaccine: Marland Kitchen Difficulty breathing  . Swelling of your face and throat  . A fast heartbeat  . A bad rash all over your body  . Dizziness and weakness    Immunizations Administered    Name Date Dose VIS Date Route   Pfizer COVID-19 Vaccine 10/11/2019  2:34 PM 0.3 mL 08/30/2019 Intramuscular   Manufacturer: [employer], [employer]   Lot: [id number]   NDC: [address]-5

## 2019-10-19 ENCOUNTER — Ambulatory Visit: Payer: PPO

## 2019-11-01 ENCOUNTER — Ambulatory Visit: Payer: PPO | Attending: Internal Medicine

## 2019-11-01 DIAGNOSIS — Z23 Encounter for immunization: Secondary | ICD-10-CM | POA: Insufficient documentation

## 2019-11-01 NOTE — Progress Notes (Signed)
   Covid-19 Vaccination Clinic  Name:  Sarah Odonnell    MRN: [id number] DOB: 1941/05/24  11/01/2019  [name] was observed post Covid-19 immunization for 30 minutes based on pre-vaccination screening without incidence. She was provided with Vaccine Information Sheet and instruction to access the V-Safe system.   [name] was instructed to call 911 with any severe reactions post vaccine: Marland Kitchen Difficulty breathing  . Swelling of your face and throat  . A fast heartbeat  . A bad rash all over your body  . Dizziness and weakness    Immunizations Administered    Name Date Dose VIS Date Route   Pfizer COVID-19 Vaccine 11/01/2019  9:21 AM 0.3 mL 08/30/2019 Intramuscular   Manufacturer: [employer], [employer]   Lot: [id number]   NDC: [address]-3

## 2019-12-30 ENCOUNTER — Other Ambulatory Visit: Payer: Self-pay | Admitting: Internal Medicine

## 2019-12-30 DIAGNOSIS — Z1231 Encounter for screening mammogram for malignant neoplasm of breast: Secondary | ICD-10-CM

## 2020-01-07 ENCOUNTER — Ambulatory Visit
Admission: RE | Admit: 2020-01-07 | Discharge: 2020-01-07 | Disposition: A | Discharge status: Home / Self Care | Payer: PPO | Source: Ambulatory Visit | Attending: Internal Medicine | Admitting: Internal Medicine

## 2020-01-07 ENCOUNTER — Other Ambulatory Visit: Payer: Self-pay

## 2020-01-07 DIAGNOSIS — Z1231 Encounter for screening mammogram for malignant neoplasm of breast: Secondary | ICD-10-CM

## 2020-01-15 DIAGNOSIS — M810 Age-related osteoporosis without current pathological fracture: Secondary | ICD-10-CM | POA: Diagnosis not present

## 2020-01-15 DIAGNOSIS — E7849 Other hyperlipidemia: Secondary | ICD-10-CM | POA: Diagnosis not present

## 2020-01-15 DIAGNOSIS — R946 Abnormal results of thyroid function studies: Secondary | ICD-10-CM | POA: Diagnosis not present

## 2020-01-22 DIAGNOSIS — R82998 Other abnormal findings in urine: Secondary | ICD-10-CM | POA: Diagnosis not present

## 2020-01-22 DIAGNOSIS — E785 Hyperlipidemia, unspecified: Secondary | ICD-10-CM | POA: Diagnosis not present

## 2020-01-22 DIAGNOSIS — M810 Age-related osteoporosis without current pathological fracture: Secondary | ICD-10-CM | POA: Diagnosis not present

## 2020-01-22 DIAGNOSIS — Z0000 Encounter for general adult medical examination without abnormal findings: Secondary | ICD-10-CM | POA: Diagnosis not present

## 2020-01-22 DIAGNOSIS — Z1331 Encounter for screening for depression: Secondary | ICD-10-CM | POA: Diagnosis not present

## 2020-01-23 DIAGNOSIS — Z1212 Encounter for screening for malignant neoplasm of rectum: Secondary | ICD-10-CM | POA: Diagnosis not present

## 2020-06-03 DIAGNOSIS — Z961 Presence of intraocular lens: Secondary | ICD-10-CM | POA: Diagnosis not present

## 2020-06-03 DIAGNOSIS — H26493 Other secondary cataract, bilateral: Secondary | ICD-10-CM | POA: Diagnosis not present

## 2020-06-03 DIAGNOSIS — H04221 Epiphora due to insufficient drainage, right lacrimal gland: Secondary | ICD-10-CM | POA: Diagnosis not present

## 2020-06-03 DIAGNOSIS — H5213 Myopia, bilateral: Secondary | ICD-10-CM | POA: Diagnosis not present

## 2020-12-11 ENCOUNTER — Other Ambulatory Visit: Payer: Self-pay

## 2020-12-11 ENCOUNTER — Ambulatory Visit (INDEPENDENT_AMBULATORY_CARE_PROVIDER_SITE_OTHER): Payer: PPO

## 2020-12-11 ENCOUNTER — Ambulatory Visit: Payer: PPO | Admitting: Podiatry

## 2020-12-11 ENCOUNTER — Encounter: Payer: Self-pay | Admitting: Podiatry

## 2020-12-11 DIAGNOSIS — M79672 Pain in left foot: Secondary | ICD-10-CM | POA: Diagnosis not present

## 2020-12-11 DIAGNOSIS — M779 Enthesopathy, unspecified: Secondary | ICD-10-CM

## 2020-12-11 MED ORDER — TRIAMCINOLONE ACETONIDE 10 MG/ML IJ SUSP
10.0000 mg | Freq: Once | INTRAMUSCULAR | Status: AC
  Administered 2020-12-11: 10 mg

## 2020-12-14 NOTE — Progress Notes (Signed)
Subjective:   Patient ID: Sarah Odonnell, female   DOB: 80 y.o.   MRN: [id number]   HPI Patient presents with a lot of pain in the left forefoot and states that this is been present now for several months and she does not remember injury or trauma that may have occurred.  States it gets sore when she walks.  Patient does not smoke and would like to stay active   Review of Systems  All other systems reviewed and are negative.       Objective:  Physical Exam Vitals and nursing note reviewed.  Constitutional:      Appearance: She is well-developed.  Pulmonary:     Effort: Pulmonary effort is normal.  Musculoskeletal:        General: Normal range of motion.  Skin:    General: Skin is warm.  Neurological:     Mental Status: She is alert.     Neurovascular status found to be intact muscle strength was noted to be adequate range of motion adequate.  Patient has forefoot inflammation with swelling around the second metatarsal phalangeal joint left with fluid buildup of the joint with palpation.  Patient is found to have good digital perfusion well oriented x3     Assessment:  Inflammatory capsulitis second MPJ left with pain     Plan:  H&P condition x-rays reviewed and today I went ahead did sterile anesthesia of the left forefoot I then aspirated the second MPJ getting out a small amount of clear fluid and injected with 1/4 cc of dexamethasone Kenalog and applied padding to reduce pressure on the joint surface.  Patient is encouraged to continue with support and may require orthotics which I educated her on today  X-rays were negative for signs of fracture or arthritis associated with this condition

## 2021-01-19 DIAGNOSIS — R946 Abnormal results of thyroid function studies: Secondary | ICD-10-CM | POA: Diagnosis not present

## 2021-01-19 DIAGNOSIS — M810 Age-related osteoporosis without current pathological fracture: Secondary | ICD-10-CM | POA: Diagnosis not present

## 2021-01-19 DIAGNOSIS — E785 Hyperlipidemia, unspecified: Secondary | ICD-10-CM | POA: Diagnosis not present

## 2021-01-21 ENCOUNTER — Other Ambulatory Visit: Payer: Self-pay | Admitting: Internal Medicine

## 2021-01-21 DIAGNOSIS — Z1231 Encounter for screening mammogram for malignant neoplasm of breast: Secondary | ICD-10-CM

## 2021-02-03 DIAGNOSIS — R030 Elevated blood-pressure reading, without diagnosis of hypertension: Secondary | ICD-10-CM | POA: Diagnosis not present

## 2021-02-03 DIAGNOSIS — Z1389 Encounter for screening for other disorder: Secondary | ICD-10-CM | POA: Diagnosis not present

## 2021-02-03 DIAGNOSIS — M810 Age-related osteoporosis without current pathological fracture: Secondary | ICD-10-CM | POA: Diagnosis not present

## 2021-02-03 DIAGNOSIS — Z1331 Encounter for screening for depression: Secondary | ICD-10-CM | POA: Diagnosis not present

## 2021-02-03 DIAGNOSIS — Z0000 Encounter for general adult medical examination without abnormal findings: Secondary | ICD-10-CM | POA: Diagnosis not present

## 2021-02-03 DIAGNOSIS — J310 Chronic rhinitis: Secondary | ICD-10-CM | POA: Diagnosis not present

## 2021-02-03 DIAGNOSIS — E785 Hyperlipidemia, unspecified: Secondary | ICD-10-CM | POA: Diagnosis not present

## 2021-02-03 DIAGNOSIS — R82998 Other abnormal findings in urine: Secondary | ICD-10-CM | POA: Diagnosis not present

## 2021-02-08 DIAGNOSIS — R0981 Nasal congestion: Secondary | ICD-10-CM | POA: Diagnosis not present

## 2021-02-08 DIAGNOSIS — J0100 Acute maxillary sinusitis, unspecified: Secondary | ICD-10-CM | POA: Diagnosis not present

## 2021-02-08 DIAGNOSIS — R059 Cough, unspecified: Secondary | ICD-10-CM | POA: Diagnosis not present

## 2021-03-16 ENCOUNTER — Inpatient Hospital Stay: Admission: RE | Admit: 2021-03-16 | Payer: PPO | Source: Ambulatory Visit

## 2021-05-07 ENCOUNTER — Ambulatory Visit
Admission: RE | Admit: 2021-05-07 | Discharge: 2021-05-07 | Disposition: A | Discharge status: Home / Self Care | Payer: PPO | Source: Ambulatory Visit | Attending: Internal Medicine | Admitting: Internal Medicine

## 2021-05-07 ENCOUNTER — Other Ambulatory Visit: Payer: Self-pay

## 2021-05-07 DIAGNOSIS — Z1231 Encounter for screening mammogram for malignant neoplasm of breast: Secondary | ICD-10-CM | POA: Diagnosis not present

## 2021-06-10 DIAGNOSIS — H26493 Other secondary cataract, bilateral: Secondary | ICD-10-CM | POA: Diagnosis not present

## 2021-06-10 DIAGNOSIS — Z961 Presence of intraocular lens: Secondary | ICD-10-CM | POA: Diagnosis not present

## 2021-06-10 DIAGNOSIS — H35371 Puckering of macula, right eye: Secondary | ICD-10-CM | POA: Diagnosis not present

## 2021-07-11 DIAGNOSIS — Z743 Need for continuous supervision: Secondary | ICD-10-CM | POA: Diagnosis not present

## 2021-07-11 DIAGNOSIS — R55 Syncope and collapse: Secondary | ICD-10-CM | POA: Diagnosis not present

## 2021-07-11 DIAGNOSIS — R079 Chest pain, unspecified: Secondary | ICD-10-CM | POA: Diagnosis not present

## 2021-07-11 DIAGNOSIS — J392 Other diseases of pharynx: Secondary | ICD-10-CM | POA: Diagnosis not present

## 2021-07-11 DIAGNOSIS — R531 Weakness: Secondary | ICD-10-CM | POA: Diagnosis not present

## 2021-07-11 DIAGNOSIS — R5383 Other fatigue: Secondary | ICD-10-CM | POA: Diagnosis not present

## 2021-07-11 DIAGNOSIS — E876 Hypokalemia: Secondary | ICD-10-CM | POA: Diagnosis not present

## 2021-07-11 DIAGNOSIS — U099 Post covid-19 condition, unspecified: Secondary | ICD-10-CM | POA: Diagnosis not present

## 2021-07-11 DIAGNOSIS — R0602 Shortness of breath: Secondary | ICD-10-CM | POA: Diagnosis not present

## 2021-07-11 NOTE — ED Notes (Signed)
ED Triage Note       ED Triage Adult Entered On:  07/11/2021 22:02 EDT    Performed On:  07/11/2021 21:58 EDT by Hulda Humphrey               Triage   Numeric Rating Pain Scale :   0 = No pain   Chief Complaint :   80 y/o female to ED reporting gen weakness x 1 month, near syncope today   Chief Complaint Onset :   07/11/2021 21:59 EDT   [place] Mode of Arrival :   Ambulance   Infectious Disease Documentation :   Document assessment   Patient received chemo or biotherapy last 48 hrs? :   No   Temperature Oral :   36.7 degC(Converted to: 98.1 degF)    Heart Rate Monitored :   81 bpm   Respiratory Rate :   18 br/min   Systolic Blood Pressure :   188 mmHg (>HHI)    Diastolic Blood Pressure :   80 mmHg   SpO2 :   100 %   Oxygen Therapy :   Room air   Patient presentation :   SBP </= 90 mmHg   Chief Complaint or Presentation suggest infection :   No   Weight Dosing :   46 kg(Converted to: 101 lb 7 oz)    Height :   150 cm(Converted to: 4 ft 11 in)    Body Mass Index Dosing :   20 kg/m2   Hulda Humphrey - 07/11/2021 21:58 EDT   DCP GENERIC CODE   Tracking Acuity :   3   Tracking Group :   ED [address] Tracking Group   Hulda Humphrey - 07/11/2021 21:58 EDT   ED General Section :   Document assessment   Pregnancy Status :   N/A   ED Allergies Section :   Document assessment   ED Reason for Visit Section :   Document assessment   Hulda Humphrey - 07/11/2021 21:58 EDT   PTA/Triage Treatments   ED PTA Pre-Arrival Service :   [facility]   [place],  [place] - 07/11/2021 21:58 EDT   ID Risk Screen Symptoms   Recent Travel History :   No recent travel   Last 90 days COVID-19 ID :   No   Close Contact with COVID-19 ID :   No   Last 14 days COVID-19 ID :   No   C. diff Symptom/History ID :   Neither of the above   Patient Pregnant :   None of the above   MRSA/VRE Screening :   None of these apply   CRE Screening :   No   Hulda Humphrey - 07/11/2021 21:58 EDT   Allergies   (As Of: 07/11/2021 22:02:46  EDT)   Allergies (Active)   penicillin  Estimated Onset Date:   Unspecified ; Reactions:   Itching ; Created By:   Hulda Humphrey; Reaction Status:   Active ; Category:   Drug ; Substance:   penicillin ; Type:   Allergy ; Severity:   Mild ; Updated By:   Hulda Humphrey; Reviewed Date:   07/11/2021 22:02 EDT        Psycho-Social   Last 3 mo, thoughts killing self/others :   Patient denies   Right click within box for Suspected Abuse policy link. :   None   Feels Safe Where Live :  Yes   ED Behavioral Activity Rating Scale :   4 - Quiet and awake (normal level of activity)   Hulda Humphrey - 07/11/2021 21:58 EDT   ED Reason for Visit   (As Of: 07/11/2021 22:02:46 EDT)

## 2021-07-11 NOTE — Discharge Summary (Signed)
 ED Clinical Summary                     [facility]  [address]  [place], [place], [address]  [phone]          PERSON INFORMATION  Name: Carolyn Avila, Carolyn Avila Age:  80 Years DOB: 09-28-1940   Sex: Female Language:  PCP: PCP,  NONE   Marital Status: Married Phone: [phone] Med Service: MED-Medicine   MRN: [id number] Acct# [id number] Arrival: 07/11/2021 21:44:00   Visit Reason: General medical; EMS-DIZZY Acuity: 3 LOS: 000 01:49   Address:    [address] [place] [address]   Diagnosis:    Hypokalemia; Long COVID  Medications:    Medications Administered During Visit:                Medication Dose Route   potassium chloride 40 mEq Oral               Allergies      penicillin (Itching)      Major Tests and Procedures:  The following procedures and tests were performed during your ED visit.  COMMON PROCEDURES%>  COMMON PROCEDURES COMMENTS%>                PROVIDER INFORMATION               Provider Role Assigned Sampson Curt August ED Nurse 07/11/2021 21:58:13    MERILEE BILLS COLIN-MD ED Provider 07/11/2021 22:15:23        Attending Physician:  MERILEE BILLS COLIN-MD      Admit Doc  KODISH,  BRETT COLIN-MD     Consulting Doc       VITALS INFORMATION  Vital Sign Triage Latest   Temp Oral ORAL_1%> ORAL%>   Temp Temporal TEMPORAL_1%> TEMPORAL%>   Temp Intravascular INTRAVASCULAR_1%> INTRAVASCULAR%>   Temp Axillary AXILLARY_1%> AXILLARY%>   Temp Rectal RECTAL_1%> RECTAL%>   02 Sat 100 % 100 %   Respiratory Rate RATE_1%> RATE%>   Peripheral Pulse Rate PULSE RATE_1%> PULSE RATE%>   Apical Heart Rate HEART RATE_1%> HEART RATE%>   Blood Pressure BLOOD PRESSURE_1%>/ BLOOD PRESSURE_1%>80 mmHg BLOOD PRESSURE%> / BLOOD PRESSURE%>92 mmHg                 Immunizations      No Immunizations Documented This Visit          DISCHARGE INFORMATION   Discharge Disposition: H Outpt-Sent Home   Discharge Location:  Home   Discharge Date and Time:  07/11/2021 23:33:26   ED Checkout Date and  Time:  07/11/2021 23:33:26     DEPART REASON INCOMPLETE INFORMATION               Depart Action Incomplete Reason   Interactive View/I&O Recently assessed               Problems      No Problems Documented              Smoking Status      No Smoking Status Documented         PATIENT EDUCATION INFORMATION  Instructions:     Hypokalemia     Follow up:                   With: Address: When:   Primary care provider or clinic  Within 1 week   Comments:   Follow-up with your primary care provider. If  you do not have one, refer to the discharge instructions for getting a new one or the attached clinic referral list.              ED PROVIDER DOCUMENTATION     Patient:   Carolyn Avila, Carolyn Avila             MRN: [id number]            FIN: [phone]               Age:   80 years     Sex:  Female     DOB:  January 01, 1941   Associated Diagnoses:   Long COVID; Hypokalemia   Author:   MERILEE BILLS COLIN-MD      Basic Information   Time seen: Provider Seen (ST)   ED Provider/Time:    KODISH,  BRETT COLIN-MD / 07/11/2021 22:15  .   Additional information: Chief Complaint from Nursing Triage Note   Chief Complaint  Chief Complaint: 80 y/o female to ED reporting gen weakness x 1 month, near syncope today (07/11/21 21:58:00).      History of Present Illness   80 year old female, generally healthy for age, presenting today for evaluation of fatigue.  Says has been persistent since she had COVID back in August.  Said the COVID itself was very mild, however she never felt like she got back to her normal energy level since.  She had a few episodes of burning sensation in her throat and near syncope, 1 of which was a couple of hours ago.  No active pain at this time.  She felt at the time she could not catch her breath, that she had a collapsed lung about 20 years ago and this felt like the same thing however feels like her breathing is back to normal now.      Review of Systems             Additional review of systems information: All other systems  reviewed and otherwise negative.      Health Status   Allergies:    Allergic Reactions (Selected)  Mild  Penicillin- Itching..      Past Medical/ Family/ Social History   Medical history: Reviewed as documented in chart.   Surgical history: Reviewed as documented in chart.   Family history: Not significant.   Social history: Reviewed as documented in chart.   Problem list:    No qualifying data available  .      Physical Examination               Vital Signs   Vital Signs   07/11/2021 21:58 EDT Systolic Blood Pressure 188 mmHg  >HHI    Diastolic Blood Pressure 80 mmHg    Temperature Oral 36.7 degC    Heart Rate Monitored 81 bpm    Respiratory Rate 18 br/min    SpO2 100 %   .   Measurements   07/11/2021 22:02 EDT Body Mass Index est meas 20.44 kg/m2    Body Mass Index Measured 20.44 kg/m2   07/11/2021 21:58 EDT Height/Length Measured 150 cm    Weight Dosing 46 kg   .   Basic Oxygen Information   07/11/2021 21:58 EDT Oxygen Therapy Room air    SpO2 100 %   .   General:  Alert, no acute distress.    Skin:  Warm, dry.    Head:  Normocephalic, atraumatic.    Neck:  Supple, trachea midline.  Eye:  Pupils are equal, round and reactive to light, extraocular movements are intact.    Cardiovascular:  Regular rate and rhythm, No murmur.    Respiratory:  Lungs are clear to auscultation, respirations are non-labored.    Chest wall:  No tenderness, No deformity.    Back:  Nontender, Normal range of motion.    Musculoskeletal:  Normal ROM, normal strength.    Gastrointestinal:  Soft, Nontender, Non distended, Normal bowel sounds.    Neurological:  Alert and oriented to person, place, time, and situation, No focal neurological deficit observed, normal motor observed, normal speech observed.    Psychiatric:  Cooperative, appropriate mood & affect.       Medical Decision Making   Electrocardiogram:  Emergency Provider interpretation performed by me, See ECG ED Review.    Results review:  Lab results : Lab View   07/11/2021 22:37  EDT Estimated Creatinine Clearance 43.11 mL/min   07/11/2021 22:05 EDT WBC 9.4 x10e3/mcL    RBC 4.69 x10e6/mcL    Hgb 14.4 g/dL    HCT 57.7 %    MCV 09.9 fL    MCH 30.7 pg    MCHC 34.1 g/dL    RDW 87.7 %    Platelet 337 x10e3/mcL    MPV 9.5 fL    Neutro Auto 47.5 %    Neutro Absolute 4.5 x10e3/mcL    Immature Grans Percent 0.2 %    Immature Grans Absolute 0.02 x10e3/mcL    Lymph Auto 41.0 %    Lymph Absolute 3.9 x10e3/mcL  HI    Mono Auto 9.2 %    Mono Absolute 0.9 x10e3/mcL    Eosinophil Percent 1.5 %    Eos Absolute 0.1 x10e3/mcL    Basophil Auto 0.6 %    Baso Absolute 0.1 x10e3/mcL    NRBC Absolute Auto 0.000 x10e3/mcL    NRBC Percent Auto 0.0 %    UA Color Yellow    UA Appear Clear    UA Glucose Negative mg/dL    UA Bili Negative    UA Ketones Negative mg/dL    UA Spec Grav 8.984    UA Blood Negative    UA pH 8.0    Protein U Negative    UA Urobilinogen 0.2 EU/dL    UA Nitrite Negative    UA Leuk Est Negative    Sodium Lvl 140 mmol/L    Potassium Lvl 3.4 mmol/L  LOW    Chloride 106 mmol/L    CO2 22 mmol/L    Glucose Random 98 mg/dL    BUN 14 mg/dL    Creatinine Lvl 0.7 mg/dL    AGAP 12 mmol/L    Osmolality Calc 280 mOsm/kg    Calcium Lvl 9.3 mg/dL    eGFR 87 fO/fpw/8.26f  LOW    Trop T Quant <0.010 ng/mL   .   Radiology results:  Rad Results (ST)   No qualifying data available., emergency physician interpretation: negative chest.       Reexamination/ Reevaluation   Vital signs   Basic Oxygen Information   07/11/2021 21:58 EDT Oxygen Therapy Room air    SpO2 100 %         Impression and Plan   Diagnosis   Long COVID (ICD10-CM U09.9, Discharge, Medical)   Hypokalemia (ICD10-CM E87.6, Discharge, Medical)   Plan   Condition: Stable.    Disposition: Discharged: Time  07/11/2021 23:15:00, to home.    Patient was given the following educational materials: Hypokalemia.  Follow up with: Primary care provider or clinic Within 1 week Follow-up with your primary care provider.  If you do not have one, refer to the  discharge instructions for getting a new one or the attached clinic referral list..    Counseled: I had a detailed discussion with the patient and/or guardian regarding the historical points/exam findings supporting the discharge diagnosis and need for outpatient followup. Discussed the need to return to the ER if symptoms persist/worsen, or for any questions/concerns that arise at home.

## 2021-07-11 NOTE — ED Provider Notes (Signed)
General medical        Patient:   Carolyn Avila, Carolyn Avila             MRN: [id number]            FIN: [id number]               Age:   80 years     Sex:  Female     DOB:  [birth year]   Associated Diagnoses:   Long COVID; Hypokalemia   Author:   Thornton Papas COLIN-MD      Basic Information   Time seen: Provider Seen (ST)   ED Provider/Time:    Ysabella Babiarz,  Barbera Perritt COLIN-MD / 07/11/2021 22:15  .   Additional information: Chief Complaint from Nursing Triage Note   Chief Complaint  Chief Complaint: 80 y/o female to ED reporting gen weakness x 1 month, near syncope today (07/11/21 21:58:00).      History of Present Illness   80 year old female, generally healthy for age, presenting today for evaluation of fatigue.  Says has been persistent since she had COVID back in August.  Said the COVID itself was very mild, however she never felt like she got back to her normal energy level since.  She had a few episodes of burning sensation in her throat and near syncope, 1 of which was a couple of hours ago.  No active pain at this time.  She felt at the time she could not catch her breath, that she had a collapsed lung about 20 years ago and this felt like the same thing however feels like her breathing is back to normal now.      Review of Systems             Additional review of systems information: All other systems reviewed and otherwise negative.      Health Status   Allergies:    Allergic Reactions (Selected)  Mild  Penicillin- Itching..      Past Medical/ Family/ Social History   Medical history: Reviewed as documented in chart.   Surgical history: Reviewed as documented in chart.   Family history: Not significant.   Social history: Reviewed as documented in chart.   Problem list:    No qualifying data available  .      Physical Examination               Vital Signs   Vital Signs   [id number]/[id number] 43:32 EDT Systolic Blood Pressure 951 mmHg  >HHI    Diastolic Blood Pressure 80 mmHg    Temperature Oral 36.7 degC    Heart Rate Monitored 81 bpm     Respiratory Rate 18 br/min    SpO2 100 %   .   Measurements   07/11/2021 22:02 EDT Body Mass Index est meas 20.44 kg/m2    Body Mass Index Measured 20.44 kg/m2   07/11/2021 21:58 EDT Height/Length Measured 150 cm    Weight Dosing 46 kg   .   Basic Oxygen Information   07/11/2021 21:58 EDT Oxygen Therapy Room air    SpO2 100 %   .   General:  Alert, no acute distress.    Skin:  Warm, dry.    Head:  Normocephalic, atraumatic.    Neck:  Supple, trachea midline.    Eye:  Pupils are equal, round and reactive to light, extraocular movements are intact.    Cardiovascular:  Regular rate and rhythm, No murmur.    Respiratory:  Lungs  are clear to auscultation, respirations are non-labored.    Chest wall:  No tenderness, No deformity.    Back:  Nontender, Normal range of motion.    Musculoskeletal:  Normal ROM, normal strength.    Gastrointestinal:  Soft, Nontender, Non distended, Normal bowel sounds.    Neurological:  Alert and oriented to person, place, time, and situation, No focal neurological deficit observed, normal motor observed, normal speech observed.    Psychiatric:  Cooperative, appropriate mood & affect.       Medical Decision Making   Electrocardiogram:  Emergency Provider interpretation performed by me, See ECG ED Review.    Results review:  Lab results : Lab View   07/11/2021 22:37 EDT Estimated Creatinine Clearance 43.11 mL/min   07/11/2021 22:05 EDT WBC 9.4 x10e3/mcL    RBC 4.69 x10e6/mcL    Hgb 14.4 g/dL    HCT 42.2 %    MCV 90.0 fL    MCH 30.7 pg    MCHC 34.1 g/dL    RDW 12.2 %    Platelet 337 x10e3/mcL    MPV 9.5 fL    Neutro Auto 47.5 %    Neutro Absolute 4.5 x10e3/mcL    Immature Grans Percent 0.2 %    Immature Grans Absolute 0.02 x10e3/mcL    Lymph Auto 41.0 %    Lymph Absolute 3.9 x10e3/mcL  HI    Mono Auto 9.2 %    Mono Absolute 0.9 x10e3/mcL    Eosinophil Percent 1.5 %    Eos Absolute 0.1 x10e3/mcL    Basophil Auto 0.6 %    Baso Absolute 0.1 x10e3/mcL    NRBC Absolute Auto 0.000 x10e3/mcL    NRBC  Percent Auto 0.0 %    UA Color Yellow    UA Appear Clear    UA Glucose Negative mg/dL    UA Bili Negative    UA Ketones Negative mg/dL    UA Spec Grav 1.015    UA Blood Negative    UA pH 8.0    Protein U Negative    UA Urobilinogen 0.2 EU/dL    UA Nitrite Negative    UA Leuk Est Negative    Sodium Lvl 140 mmol/L    Potassium Lvl 3.4 mmol/L  LOW    Chloride 106 mmol/L    CO2 22 mmol/L    Glucose Random 98 mg/dL    BUN 14 mg/dL    Creatinine Lvl 0.7 mg/dL    AGAP 12 mmol/L    Osmolality Calc 280 mOsm/kg    Calcium Lvl 9.3 mg/dL    eGFR 87 mL/min/1.60m???  LOW    Trop T Quant <0.010 ng/mL   .   Radiology results:  Rad Results (ST)   No qualifying data available., emergency physician interpretation: negative chest.       Reexamination/ Reevaluation   Vital signs   Basic Oxygen Information   07/11/2021 21:58 EDT Oxygen Therapy Room air    SpO2 100 %         Impression and Plan   Diagnosis   Long COVID (ICD10-CM U09.9, Discharge, Medical)   Hypokalemia (ICD10-CM E87.6, Discharge, Medical)   Plan   Condition: Stable.    Disposition: Discharged: Time  07/11/2021 23:15:00, to home.    Patient was given the following educational materials: Hypokalemia.    Follow up with: Primary care provider or clinic Within 1 week Follow-up with your primary care provider.  If you do not have one, refer to the discharge instructions for  getting a new one or the attached clinic referral list..    Counseled: I had a detailed discussion with the patient and/or guardian regarding the historical points/exam findings supporting the discharge diagnosis and need for outpatient followup. Discussed the need to return to the ER if symptoms persist/worsen, or for any questions/concerns that arise at home.      Signature Line     Electronically Signed on 07/11/2021 11:15 PM EDT   ________________________________________________   Thornton Papas COLIN-MD               Modified by: Thornton Papas COLIN-MD on 07/11/2021 11:15 PM EDT

## 2021-07-11 NOTE — ED Notes (Signed)
ED Pre-Arrival Note        Pre-Arrival Summary    Name:  MEDIC #35,    Current Date:  07/11/2021 21:58:07 EDT  Gender:  Female  Date of Birth:    Age:  80  Pre-Arrival Type:  EMS  ETA:  07/11/2021 21:48:00 EDT  Primary Care Physician:    Presenting Problem:  DIZZY  Pre-Arrival User:  Dan Humphreys RN, [facility]  Referring Source:    Location:  [facility]            PreArrival Communication Form  Emergency Department        Additional Patient Information:        Orders:  [    ] CBC                                            [     ] CT Head no contrast  [    ] BMP                                           [     ] CT Abdomen/Pelvis no contrast  [    ] PT/INR                                       [     ] CT Abdomen/Pelvis IV contrast, w/ oral contrast  [    ] Troponin                                   [     ] CT Abdomen/Pelvis IV contrast, no oral contrast  [    ] BNP                                            [     ] See ordersheet  [    ] CXR                                             [     ] Other:__________________________  [    ] EKG

## 2021-07-11 NOTE — ED Notes (Signed)
 ED Patient Summary       ;       [facility] Emergency Department  [address] [place] [address]  [phone]  Discharge Instructions (Patient)  Name: Carolyn Avila, Carolyn Avila  DOB: 02-20-1941                   MRN: [id number]                   FIN: [id number]  Reason For Visit: General medical; EMS-DIZZY  Final Diagnosis: Hypokalemia; Long COVID     Visit Date: 07/11/2021 21:44:00  Address: [address] [facility] [address]  Phone: [phone]     Emergency Department Providers:         Primary Physician:      KODISH, BRETT COLIN      [facility] would like to thank you for allowing us  to assist you with your healthcare needs. The following includes patient education materials and information regarding your injury/illness.     Follow-up Instructions:  You were seen today on an emergency basis. Please contact your primary care doctor for a follow up appointment. If you received a referral to a specialist doctor, it is important you follow-up as instructed.    It is important that you call your follow-up doctor to schedule and confirm the location of your next appointment. Your doctor may practice at multiple locations. The office location of your follow-up appointment may be different to the one written on your discharge instructions.    If you do not have a primary care doctor, please call [phone] for help in finding a [name] Provider. For help in finding a specialist doctor, please call [phone].    If your condition gets worse before your follow-up with your primary care doctor or specialist, please return to the Emergency Department.      Coronavirus 2019 (COVID-19) Reminders:     Patients age 4 - 49, with parental consent, and patients over age 3 can make an appointment for a COVID-19 vaccine. Patients can contact their [name] doctors' offices to schedule an appointment to receive the COVID-19 vaccine. Patients who do  not have a [name] physician can call [phone] to schedule vaccination appointments.      Follow Up Appointments:  Primary Care Provider:     Name: PCP,  NONE     Phone:                  With: Address: When:   Primary care provider or clinic  Within 1 week   Comments:   Follow-up with your primary care provider. If you do not have one, refer to the discharge instructions for getting a new one or the attached clinic referral list.              [facility] SERVICES%>    Allergy Info: penicillin     Discharge Additional Information          Discharge Patient 07/11/21 23:15:00 EDT      Patient Education Materials:        Hypokalemia    Hypokalemia means that the amount of potassium in the blood is lower than normal. Potassium is a chemical (electrolyte) that helps regulate the amount of fluid in the body. It also stimulates muscle tightening (contraction) and helps nerves work properly.    Normally, most of the body's potassium  is inside cells, and only a very small amount is in the blood. Because the amount in the blood is so small, minor changes to potassium levels in the blood can be life-threatening.      What are the causes?    This condition may be caused by:   Antibiotic medicine.     Diarrhea or vomiting. Taking too much of a medicine that helps you have a bowel movement (laxative) can cause diarrhea and lead to hypokalemia.     Chronic kidney disease (CKD).     Medicines that help the body get rid of excess fluid (diuretics).     Eating disorders, such as bulimia.     Low magnesium levels in the body.     Sweating a lot.        What are the signs or symptoms?    Symptoms of this condition include:   Weakness.     Constipation.     Fatigue.     Muscle cramps.     Mental confusion.     Skipped heartbeats or irregular heartbeat (palpitations).     Tingling or numbness.        How is this diagnosed?    This condition is diagnosed with a blood test.      How is this treated?     This condition may be treated by:   Taking potassium supplements by mouth.     Adjusting the medicines that you take.     Eating more foods that contain a lot of potassium.      If your potassium level is very low, you may need to get potassium through an IV and be monitored in the hospital.      Follow these instructions at home:       Take over-the-counter and prescription medicines only as told by your health care provider. This includes vitamins and supplements.     Eat a healthy diet. A healthy diet includes fresh fruits and vegetables, whole grains, healthy fats, and lean proteins.     If instructed, eat more foods that contain a lot of potassium. This includes:  ? Nuts, such as peanuts and pistachios.    ? Seeds, such as sunflower seeds and pumpkin seeds.    ? Peas, lentils, and lima beans.    ? Whole grain and bran cereals and breads.    ? Fresh fruits and vegetables, such as apricots, avocado, bananas, cantaloupe, kiwi, oranges, tomatoes, asparagus, and potatoes.    ? Orange juice.    ? Tomato juice.    ? Red meats.    ? Yogurt.       Keep all follow-up visits as told by your health care provider. This is important.      Contact a health care provider if you:     Have weakness that gets worse.     Feel your heart pounding or racing.     Vomit.     Have diarrhea.     Have diabetes (diabetes mellitus) and you have trouble keeping your blood sugar (glucose) in your target range.      Get help right away if you:     Have chest pain.     Have shortness of breath.     Have vomiting or diarrhea that lasts for more than 2 days.     Faint.      Summary     Hypokalemia means that the amount of potassium in the blood is  lower than normal.     This condition is diagnosed with a blood test.     Hypokalemia may be treated by taking potassium supplements, adjusting the medicines that you take, or eating more foods that are high in potassium.     If your potassium level is very low, you may need to get potassium through an  IV and be monitored in the hospital.      This information is not intended to replace advice given to you by your health care provider. Make sure you discuss any questions you have with your health care provider.      Document Revised: 04/18/2018 Document Reviewed: 04/18/2018  Elsevier Patient Education ? 2021 Elsevier Inc.      ---------------------------------------------------------------------------------------------------------------------  [facility] allows patients to review your COVID and other test results as well as discharge documents from any [name], Emergency Department, surgical center or outpatient lab. Test results are typically available 36 hours after the test is completed.     [name] encourages you to self-enroll in the [facility] Patient Portal.     To begin your self-enrollment process, please visit [email or web address] Under [facility], click on "Sign up now".     NOTE: You must be 16 years and older to use [facility] Self-Enroll online. If you are a parent, caregiver, or guardian; you need an invite to access your child's or dependent's health records. To obtain an invite, contact the Medical Records department at [phone] Monday through Friday, 8-4:30, select option 3 . If we receive your call afterhours, we will return your call the next business day.     If you have issues trying to create or access your account, contact [employer] support at [phone] available 7 days a week 24 hours a day.     Comment:

## 2021-07-11 NOTE — ED Notes (Signed)
ED Triage Note       ED Secondary Triage Entered On:  07/11/2021 22:34 EDT    Performed On:  07/11/2021 22:34 EDT by Hulda Humphrey               General Information   Barriers to Learning :   None evident   ED Home Meds Section :   Document assessment   [facility] ED Fall Risk Section :   Document assessment   ED Advance Directives Section :   Document assessment   ED Palliative Screen :   Document assessment   Hulda Humphrey - 07/11/2021 22:34 EDT   (As Of: 07/11/2021 22:34:47 EDT)   Diagnoses(Active)    General medical  Date:   07/11/2021 ; Diagnosis Type:   Reason For Visit ; Confirmation:   Confirmed ; Clinical Dx:   General medical ; Classification:   Medical ; Clinical Service:   Emergency medicine ; Code:   PNED ; Probability:   0 ; Diagnosis Code:   [id number]             -    Procedure History   (As Of: 07/11/2021 22:34:47 EDT)     [facility] Fall Risk Assessment Tool   Hx of falling last 3 months ED Fall :   No   Patient confused or disoriented ED Fall :   No   Patient intoxicated or sedated ED Fall :   No   Patient impaired gait ED Fall :   No   Use a mobility assistance device ED Fall :   No   Patient altered elimination ED Fall :   No   [facility] ED Fall Score :   0    Hulda Humphrey - 07/11/2021 22:34 EDT   ED Advance Directive   Advance Directive :   No   Hulda Humphrey - 07/11/2021 22:34 EDT   Palliative Care   Does the Patient have a Life Limiting Illness :   None of the above, Unable to determine   Hulda Humphrey - 07/11/2021 22:34 EDT   Med Hx   Medication List   (As Of: 07/11/2021 22:34:47 EDT)

## 2021-07-11 NOTE — ED Notes (Signed)
ED Patient Education Note     Patient Education Materials Follows:  Gastroenterology     Hypokalemia    Hypokalemia means that the amount of potassium in the blood is lower than normal. Potassium is a chemical (electrolyte) that helps regulate the amount of fluid in the body. It also stimulates muscle tightening (contraction) and helps nerves work properly.    Normally, most of the body's potassium is inside cells, and only a very small amount is in the blood. Because the amount in the blood is so small, minor changes to potassium levels in the blood can be life-threatening.      What are the causes?    This condition may be caused by:   Antibiotic medicine.     Diarrhea or vomiting. Taking too much of a medicine that helps you have a bowel movement (laxative) can cause diarrhea and lead to hypokalemia.     Chronic kidney disease (CKD).     Medicines that help the body get rid of excess fluid (diuretics).     Eating disorders, such as bulimia.     Low magnesium levels in the body.     Sweating a lot.        What are the signs or symptoms?    Symptoms of this condition include:   Weakness.     Constipation.     Fatigue.     Muscle cramps.     Mental confusion.     Skipped heartbeats or irregular heartbeat (palpitations).     Tingling or numbness.        How is this diagnosed?    This condition is diagnosed with a blood test.      How is this treated?    This condition may be treated by:   Taking potassium supplements by mouth.     Adjusting the medicines that you take.     Eating more foods that contain a lot of potassium.      If your potassium level is very low, you may need to get potassium through an IV and be monitored in the hospital.      Follow these instructions at home:       Take over-the-counter and prescription medicines only as told by your health care provider. This includes vitamins and supplements.     Eat a healthy diet. A healthy diet includes fresh fruits and vegetables, whole grains, healthy  fats, and lean proteins.     If instructed, eat more foods that contain a lot of potassium. This includes:  ? Nuts, such as peanuts and pistachios.    ? Seeds, such as sunflower seeds and pumpkin seeds.    ? Peas, lentils, and lima beans.    ? Whole grain and bran cereals and breads.    ? Fresh fruits and vegetables, such as apricots, avocado, bananas, cantaloupe, kiwi, oranges, tomatoes, asparagus, and potatoes.    ? Orange juice.    ? Tomato juice.    ? Red meats.    ? Yogurt.       Keep all follow-up visits as told by your health care provider. This is important.      Contact a health care provider if you:     Have weakness that gets worse.     Feel your heart pounding or racing.     Vomit.     Have diarrhea.     Have diabetes (diabetes mellitus) and you have trouble keeping your blood sugar (glucose)   in your target range.      Get help right away if you:     Have chest pain.     Have shortness of breath.     Have vomiting or diarrhea that lasts for more than 2 days.     Faint.      Summary     Hypokalemia means that the amount of potassium in the blood is lower than normal.     This condition is diagnosed with a blood test.     Hypokalemia may be treated by taking potassium supplements, adjusting the medicines that you take, or eating more foods that are high in potassium.     If your potassium level is very low, you may need to get potassium through an IV and be monitored in the hospital.      This information is not intended to replace advice given to you by your health care provider. Make sure you discuss any questions you have with your health care provider.      Document Revised: 04/18/2018 Document Reviewed: 04/18/2018  Elsevier Patient Education ? 2021 Elsevier Inc.

## 2021-07-12 LAB — TROPONIN T: Troponin T: <0.01 ng/mL (ref 0.000–0.010)

## 2021-07-12 LAB — CBC WITH AUTO DIFFERENTIAL
Absolute Baso #: 0.1 10*3/uL (ref 0.0–0.2)
Absolute Eos #: 0.1 10*3/uL (ref 0.0–0.5)
Absolute Lymph #: 3.9 10*3/uL — ABNORMAL HIGH (ref 1.0–3.2)
Absolute Mono #: 0.9 10*3/uL (ref 0.3–1.0)
Basophils %: 0.6 % (ref 0.0–2.0)
Eosinophils %: 1.5 % (ref 0.0–7.0)
Hematocrit: 42.2 % (ref 34.0–47.0)
Hemoglobin: 14.4 g/dL (ref 11.5–15.7)
Immature Grans (Abs): 0.02 10*3/uL (ref 0.00–0.06)
Immature Granulocytes: 0.2 % (ref 0.0–0.6)
Lymphocytes: 41 % (ref 15.0–45.0)
MCH: 30.7 pg (ref 27.0–34.5)
MCHC: 34.1 g/dL (ref 32.0–36.0)
MCV: 90 fL (ref 81.0–99.0)
MPV: 9.5 fL (ref 7.2–13.2)
Monocytes: 9.2 % (ref 4.0–12.0)
NRBC Absolute: 0 10*3/uL (ref 0.000–0.012)
NRBC Automated: 0 % (ref 0.0–0.2)
Neutrophils %: 47.5 % (ref 42.0–74.0)
Neutrophils Absolute: 4.5 10*3/uL (ref 1.6–7.3)
Platelets: 337 10*3/uL (ref 140–440)
RBC: 4.69 x10e6/mcL (ref 3.60–5.20)
RDW: 12.2 % (ref 11.0–16.0)
WBC: 9.4 10*3/uL (ref 3.8–10.6)

## 2021-07-12 LAB — URINALYSIS-MACROSCOPIC
Bilirubin Urine: NEGATIVE
Blood, Urine: NEGATIVE
Glucose, UA: NEGATIVE mg/dL
Ketones, Urine: NEGATIVE mg/dL
Leukocyte Esterase, Urine: NEGATIVE
Nitrite, Urine: NEGATIVE
Protein, UA: NEGATIVE
Specific Gravity, UA: 1.015 (ref 1.003–1.035)
Urobilinogen, Urine: 0.2 EU/dL
pH, UA: 8 (ref 4.5–8.0)

## 2021-07-12 LAB — BASIC METABOLIC PANEL
Anion Gap: 12 mmol/L (ref 2–17)
BUN: 14 mg/dL (ref 8–23)
CO2: 22 mmol/L (ref 22–29)
Calcium: 9.3 mg/dL (ref 8.8–10.2)
Chloride: 106 mmol/L (ref 98–107)
Creatinine: 0.7 mg/dL (ref 0.5–1.0)
Est, Glom Filt Rate: 87 mL/min/1.73m?? — ABNORMAL LOW (ref >=90)
Glucose: 98 mg/dL (ref 70–99)
OSMOLALITY CALCULATED: 280 mOsm/kg (ref 270–287)
Potassium: 3.4 mmol/L — ABNORMAL LOW (ref 3.5–5.3)
Sodium: 140 mmol/L (ref 135–145)

## 2021-07-13 LAB — ADD ON LAB TEST

## 2021-09-24 DIAGNOSIS — R5383 Other fatigue: Secondary | ICD-10-CM | POA: Diagnosis not present

## 2021-09-24 DIAGNOSIS — R519 Headache, unspecified: Secondary | ICD-10-CM | POA: Diagnosis not present

## 2021-09-24 DIAGNOSIS — R0981 Nasal congestion: Secondary | ICD-10-CM | POA: Diagnosis not present

## 2021-09-24 DIAGNOSIS — J029 Acute pharyngitis, unspecified: Secondary | ICD-10-CM | POA: Diagnosis not present

## 2021-09-24 DIAGNOSIS — Z1152 Encounter for screening for COVID-19: Secondary | ICD-10-CM | POA: Diagnosis not present

## 2021-09-24 DIAGNOSIS — J0100 Acute maxillary sinusitis, unspecified: Secondary | ICD-10-CM | POA: Diagnosis not present

## 2021-10-07 IMAGING — MG MM DIGITAL SCREENING BILAT W/ TOMO AND CAD
8 series · 9 of 24 positions shown · non-contrast
Comparison: Previous exam(s).

CLINICAL DATA: Screening.

EXAM:
DIGITAL SCREENING BILATERAL MAMMOGRAM WITH TOMOSYNTHESIS AND CAD
TECHNIQUE: Bilateral screening digital craniocaudal and mediolateral oblique
mammograms were obtained. Bilateral screening digital breast
tomosynthesis was performed. The images were evaluated with
computer-aided detection.

[R MLO synth-2D]
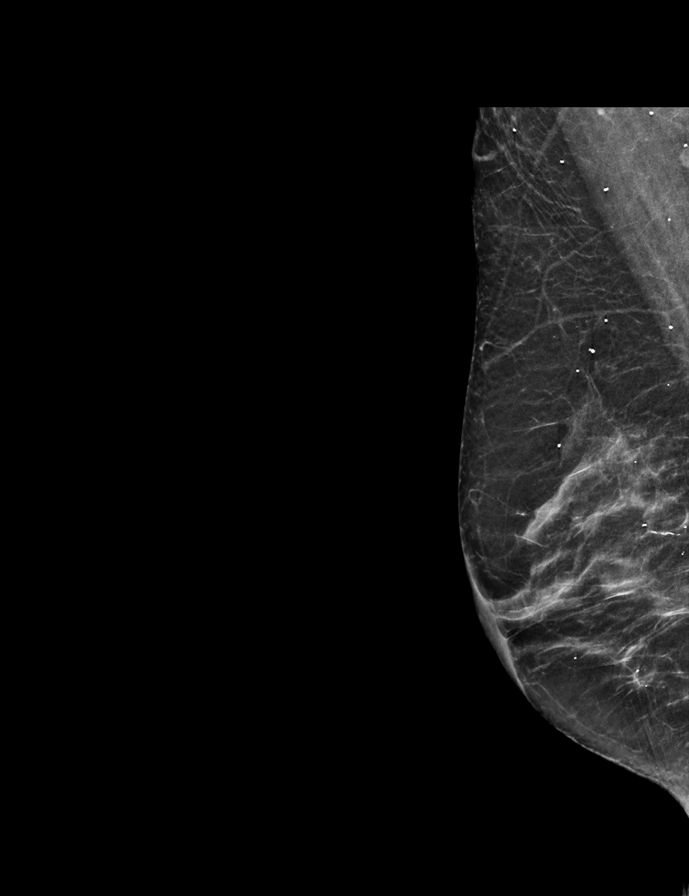

[L MLO synth-2D]
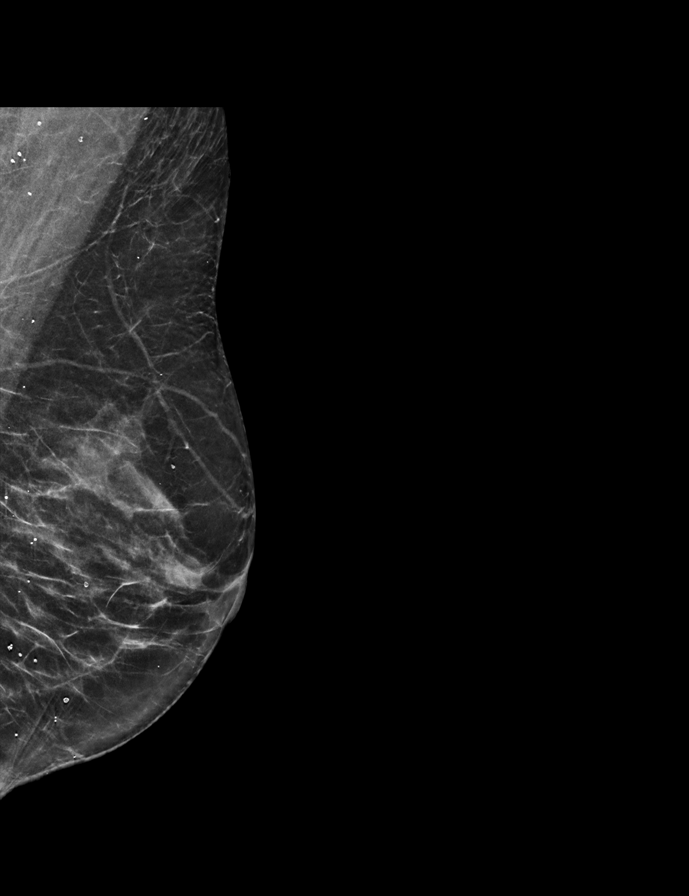

[L CC synth-2D]
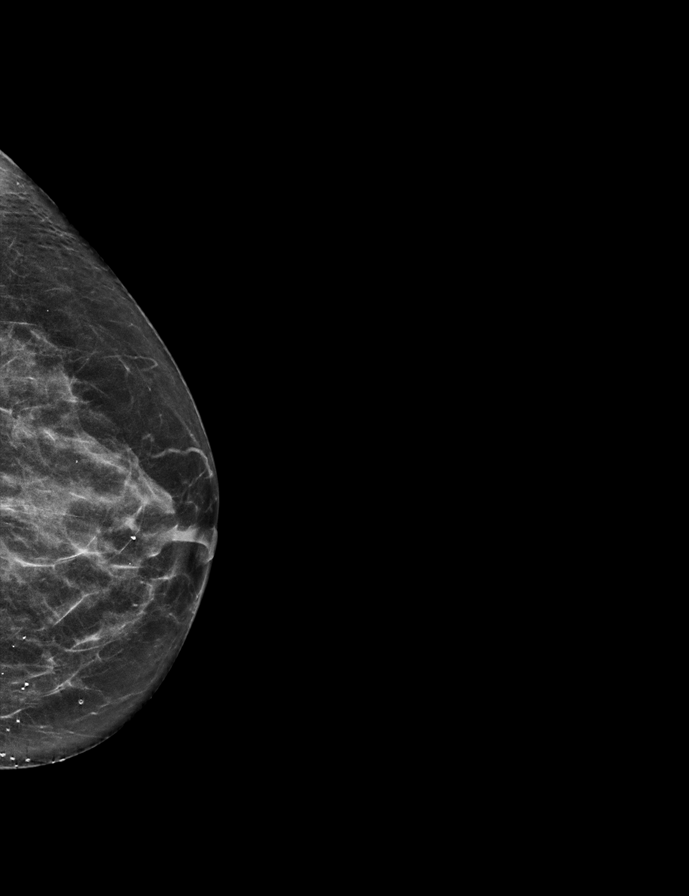

[R CC synth-2D]
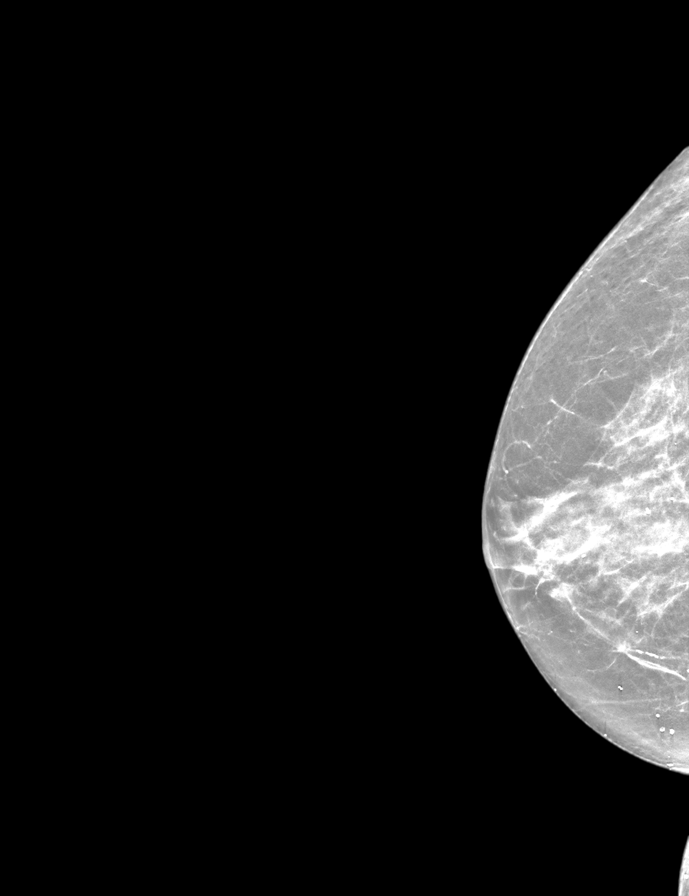

[L CC tomo · 2 of 49 frames shown]
[frame 16/49]
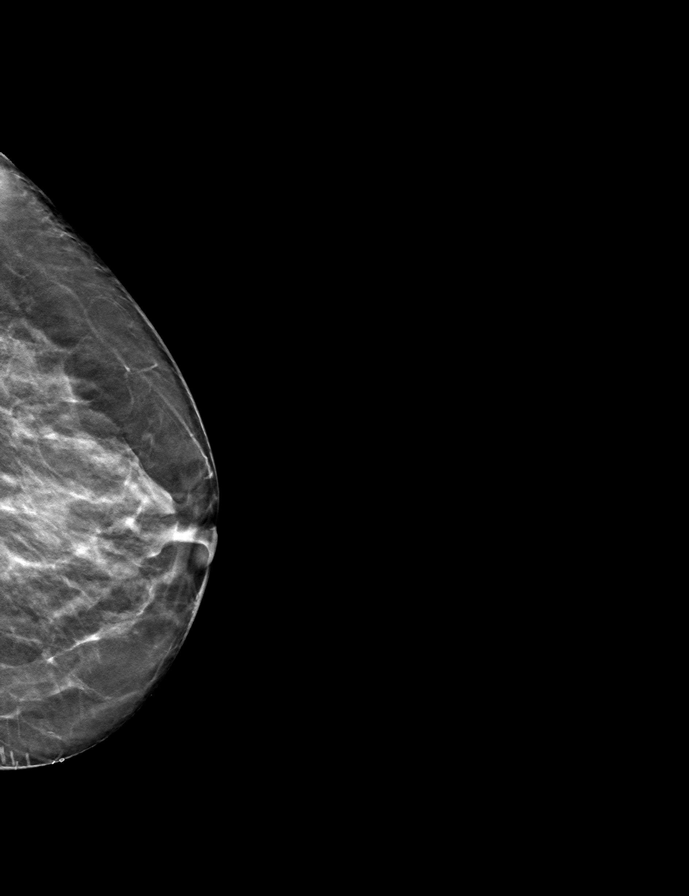
[frame 25/49]
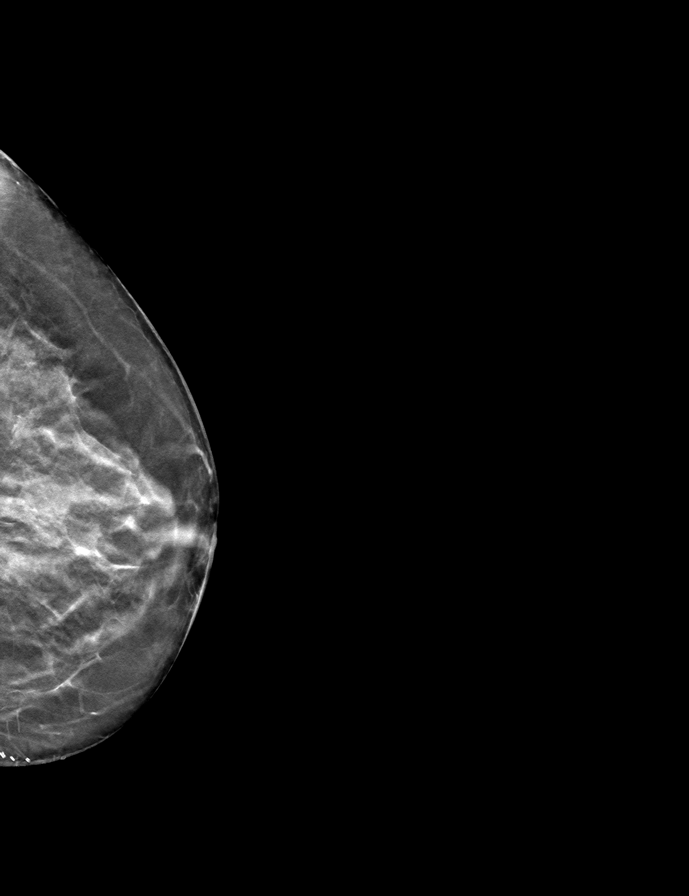

[L MLO tomo · tomo slice 29/58.0]
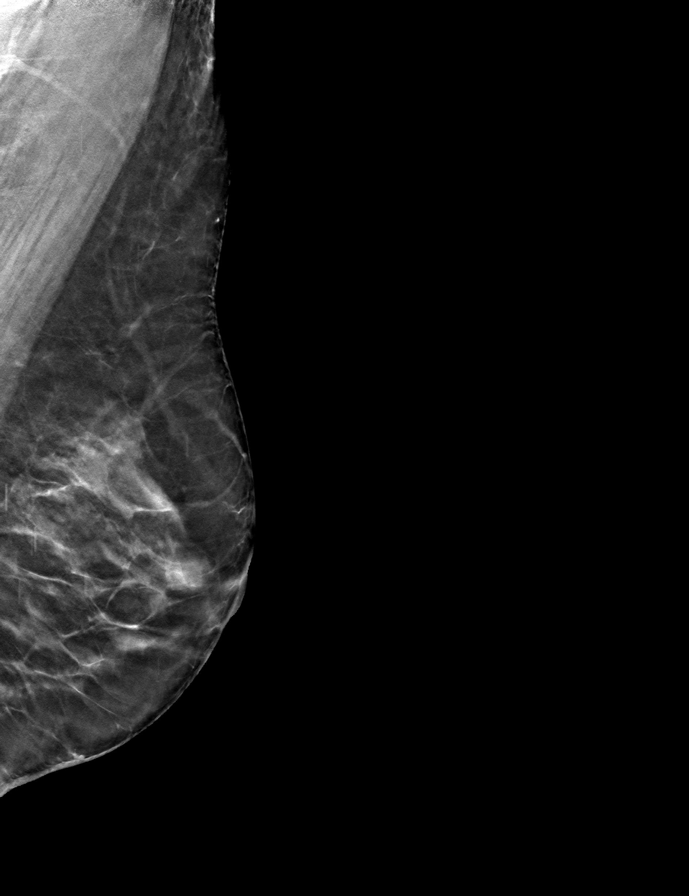

[R MLO tomo · tomo slice 27/52.0]
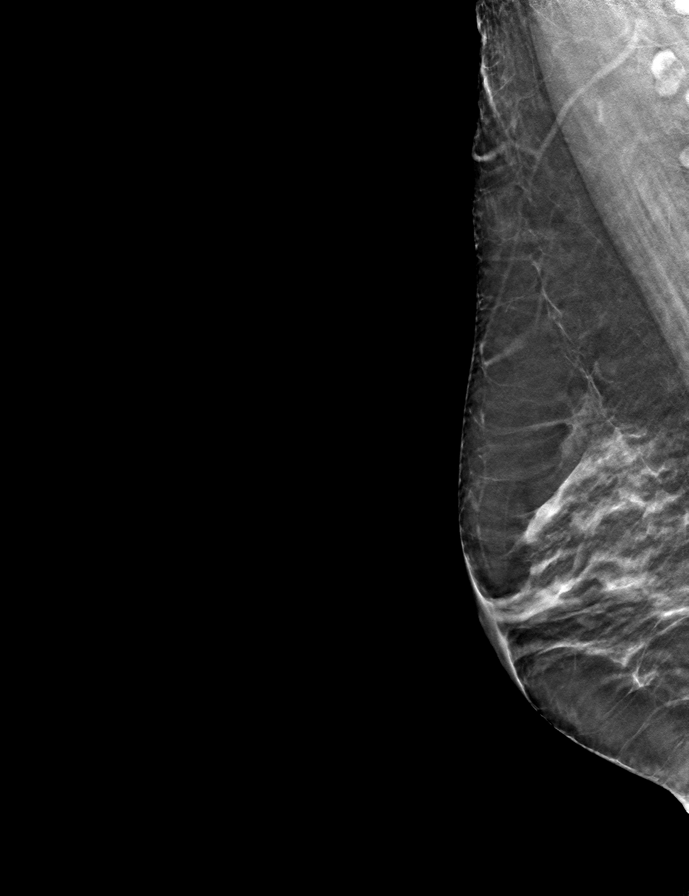

[R CC tomo · tomo slice 25/50.0]
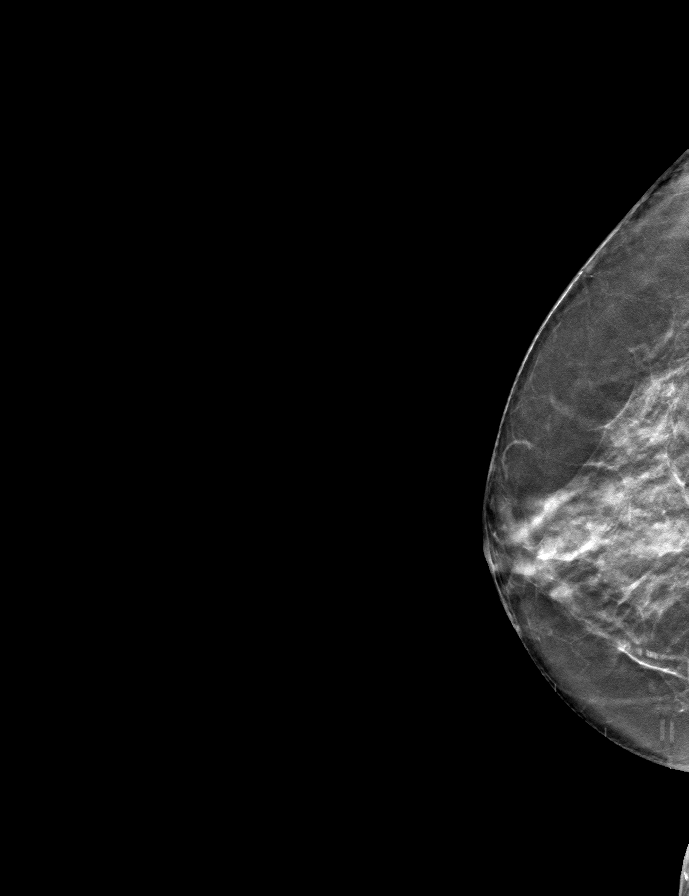

[9 of 24 positions shown; findings below may reference images not displayed]

ACR Breast Density Category c: The breast tissue is heterogeneously
dense, which may obscure small masses.
FINDINGS: There are no findings suspicious for malignancy.
IMPRESSION: No mammographic evidence of malignancy. A result letter of this
screening mammogram will be mailed directly to the patient.

RECOMMENDATION:
Screening mammogram in one year. (Code:[id number])

BI-RADS CATEGORY  1: Negative.

## 2022-02-01 DIAGNOSIS — R946 Abnormal results of thyroid function studies: Secondary | ICD-10-CM | POA: Diagnosis not present

## 2022-02-01 DIAGNOSIS — R7989 Other specified abnormal findings of blood chemistry: Secondary | ICD-10-CM | POA: Diagnosis not present

## 2022-02-01 DIAGNOSIS — M810 Age-related osteoporosis without current pathological fracture: Secondary | ICD-10-CM | POA: Diagnosis not present

## 2022-02-01 DIAGNOSIS — E785 Hyperlipidemia, unspecified: Secondary | ICD-10-CM | POA: Diagnosis not present

## 2022-02-08 DIAGNOSIS — M810 Age-related osteoporosis without current pathological fracture: Secondary | ICD-10-CM | POA: Diagnosis not present

## 2022-02-08 DIAGNOSIS — Z0000 Encounter for general adult medical examination without abnormal findings: Secondary | ICD-10-CM | POA: Diagnosis not present

## 2022-02-08 DIAGNOSIS — E785 Hyperlipidemia, unspecified: Secondary | ICD-10-CM | POA: Diagnosis not present

## 2022-02-08 DIAGNOSIS — J45909 Unspecified asthma, uncomplicated: Secondary | ICD-10-CM | POA: Diagnosis not present

## 2022-02-08 DIAGNOSIS — Z1331 Encounter for screening for depression: Secondary | ICD-10-CM | POA: Diagnosis not present

## 2022-02-08 DIAGNOSIS — J310 Chronic rhinitis: Secondary | ICD-10-CM | POA: Diagnosis not present

## 2022-02-08 DIAGNOSIS — Z1339 Encounter for screening examination for other mental health and behavioral disorders: Secondary | ICD-10-CM | POA: Diagnosis not present

## 2022-02-08 DIAGNOSIS — R030 Elevated blood-pressure reading, without diagnosis of hypertension: Secondary | ICD-10-CM | POA: Diagnosis not present

## 2022-05-26 DIAGNOSIS — J45909 Unspecified asthma, uncomplicated: Secondary | ICD-10-CM | POA: Diagnosis not present

## 2022-05-26 DIAGNOSIS — Z1152 Encounter for screening for COVID-19: Secondary | ICD-10-CM | POA: Diagnosis not present

## 2022-05-26 DIAGNOSIS — R0981 Nasal congestion: Secondary | ICD-10-CM | POA: Diagnosis not present

## 2022-05-26 DIAGNOSIS — J029 Acute pharyngitis, unspecified: Secondary | ICD-10-CM | POA: Diagnosis not present

## 2022-05-26 DIAGNOSIS — R5383 Other fatigue: Secondary | ICD-10-CM | POA: Diagnosis not present

## 2022-05-26 DIAGNOSIS — R058 Other specified cough: Secondary | ICD-10-CM | POA: Diagnosis not present

## 2022-05-26 DIAGNOSIS — J101 Influenza due to other identified influenza virus with other respiratory manifestations: Secondary | ICD-10-CM | POA: Diagnosis not present

## 2022-06-14 ENCOUNTER — Other Ambulatory Visit: Payer: Self-pay | Admitting: Internal Medicine

## 2022-06-14 DIAGNOSIS — Z1231 Encounter for screening mammogram for malignant neoplasm of breast: Secondary | ICD-10-CM

## 2022-06-17 DIAGNOSIS — H26493 Other secondary cataract, bilateral: Secondary | ICD-10-CM | POA: Diagnosis not present

## 2022-06-17 DIAGNOSIS — H35371 Puckering of macula, right eye: Secondary | ICD-10-CM | POA: Diagnosis not present

## 2022-06-17 DIAGNOSIS — Z961 Presence of intraocular lens: Secondary | ICD-10-CM | POA: Diagnosis not present

## 2022-07-14 ENCOUNTER — Ambulatory Visit: Payer: PPO

## 2022-09-06 ENCOUNTER — Ambulatory Visit
Admission: RE | Admit: 2022-09-06 | Discharge: 2022-09-06 | Disposition: A | Discharge status: Home / Self Care | Payer: PPO | Source: Ambulatory Visit | Attending: Internal Medicine | Admitting: Internal Medicine

## 2022-09-06 DIAGNOSIS — Z1231 Encounter for screening mammogram for malignant neoplasm of breast: Secondary | ICD-10-CM

## 2023-02-17 DIAGNOSIS — R030 Elevated blood-pressure reading, without diagnosis of hypertension: Secondary | ICD-10-CM | POA: Diagnosis not present

## 2023-02-17 DIAGNOSIS — R946 Abnormal results of thyroid function studies: Secondary | ICD-10-CM | POA: Diagnosis not present

## 2023-02-17 DIAGNOSIS — E785 Hyperlipidemia, unspecified: Secondary | ICD-10-CM | POA: Diagnosis not present

## 2023-02-17 DIAGNOSIS — R7989 Other specified abnormal findings of blood chemistry: Secondary | ICD-10-CM | POA: Diagnosis not present

## 2023-02-17 DIAGNOSIS — M810 Age-related osteoporosis without current pathological fracture: Secondary | ICD-10-CM | POA: Diagnosis not present

## 2023-02-22 DIAGNOSIS — R195 Other fecal abnormalities: Secondary | ICD-10-CM | POA: Diagnosis not present

## 2023-02-24 DIAGNOSIS — Z1339 Encounter for screening examination for other mental health and behavioral disorders: Secondary | ICD-10-CM | POA: Diagnosis not present

## 2023-02-24 DIAGNOSIS — R195 Other fecal abnormalities: Secondary | ICD-10-CM | POA: Diagnosis not present

## 2023-02-24 DIAGNOSIS — M810 Age-related osteoporosis without current pathological fracture: Secondary | ICD-10-CM | POA: Diagnosis not present

## 2023-02-24 DIAGNOSIS — Z1331 Encounter for screening for depression: Secondary | ICD-10-CM | POA: Diagnosis not present

## 2023-02-24 DIAGNOSIS — E785 Hyperlipidemia, unspecified: Secondary | ICD-10-CM | POA: Diagnosis not present

## 2023-02-24 DIAGNOSIS — J310 Chronic rhinitis: Secondary | ICD-10-CM | POA: Diagnosis not present

## 2023-02-24 DIAGNOSIS — R600 Localized edema: Secondary | ICD-10-CM | POA: Diagnosis not present

## 2023-02-24 DIAGNOSIS — R030 Elevated blood-pressure reading, without diagnosis of hypertension: Secondary | ICD-10-CM | POA: Diagnosis not present

## 2023-02-24 DIAGNOSIS — J45909 Unspecified asthma, uncomplicated: Secondary | ICD-10-CM | POA: Diagnosis not present

## 2023-02-24 DIAGNOSIS — Z0000 Encounter for general adult medical examination without abnormal findings: Secondary | ICD-10-CM | POA: Diagnosis not present

## 2023-03-08 DIAGNOSIS — R197 Diarrhea, unspecified: Secondary | ICD-10-CM | POA: Diagnosis not present

## 2023-03-31 ENCOUNTER — Other Ambulatory Visit: Payer: Self-pay

## 2023-04-05 ENCOUNTER — Encounter: Payer: Self-pay | Admitting: Internal Medicine

## 2023-04-05 ENCOUNTER — Ambulatory Visit: Payer: PPO | Admitting: Internal Medicine

## 2023-04-05 VITALS — BP 122/86 | HR 81 | Ht 59.0 in | Wt 97.1 lb

## 2023-04-05 DIAGNOSIS — K638219 Small intestinal bacterial overgrowth, unspecified: Secondary | ICD-10-CM

## 2023-04-05 DIAGNOSIS — K589 Irritable bowel syndrome without diarrhea: Secondary | ICD-10-CM

## 2023-04-05 MED ORDER — METRONIDAZOLE 250 MG PO TABS: 250.0000 mg | ORAL_TABLET | Freq: Three times a day (TID) | ORAL | 0 refills | Status: AC

## 2023-04-05 NOTE — Patient Instructions (Signed)
We have sent the following medications to your pharmacy for you to pick up at your convenience: Metronidazole 250 mg: Take three times a day for 7 days  Continue Probiotic until 8-15 then stop.  Thank you for entrusting me with your care and for choosing [facility], Dr. Erick Blinks   If your blood pressure at your visit was 140/90 or greater, please contact your primary care physician to follow up on this. ______________________________________________________  If you are age 52 or older, your body mass index should be between 23-30. Your Body mass index is 19.62 kg/m. If this is out of the aforementioned range listed, please consider follow up with your Primary Care Provider.  If you are age 47 or younger, your body mass index should be between 19-25. Your Body mass index is 19.62 kg/m. If this is out of the aformentioned range listed, please consider follow up with your Primary Care Provider.  ________________________________________________________  The [facility] providers would like to encourage you to use [facility] to communicate with providers for non-urgent requests or questions.  Due to long hold times on the telephone, sending your provider a message by [facility] may be a faster and more efficient way to get a response.  Please allow 48 business hours for a response.  Please remember that this is for non-urgent requests.  _______________________________________________________  Due to recent changes in healthcare laws, you may see the results of your imaging and laboratory studies on MyChart before your provider has had a chance to review them.  We understand that in some cases there may be results that are confusing or concerning to you. Not all laboratory results come back in the same time frame and the provider may be waiting for multiple results in order to interpret others.  Please give [place] 48 hours in order for your provider to thoroughly review all the results before  contacting the office for clarification of your results.

## 2023-04-05 NOTE — Progress Notes (Signed)
Patient ID: Sarah Odonnell, female   DOB: [birth year], 82 y.o.   MRN: [id number] HPI: Sarah Odonnell is an 82 year old female with a past medical history of hyperlipidemia, osteoporosis, prior aspergillus pneumonia who is seen to evaluate altered bowel habit with abdominal gas and bloating.  She is here alone today and follows with Dr. Link Snuffer for primary care.  She previously saw Dr. Laural Benes with her last colonoscopy being 01/15/2018.  She reports all of her previous colonoscopies have been normal.  She was told she would not need another 1 based on her age.  She developed in May sudden onset of diarrhea with abdominal bloating and crampy/griping abdominal pain.  She initially had mucus in her stool.  This came after eating an extremely large amount of strawberries and lettuce that she had thrown at her property.  This simply did not get better over a period of several weeks and so she saw primary care.  She has been using Gas-X as well as an over-the-counter probiotic.  Much of the symptoms have improved however she still has some fecal urgency and bloating.  Her bowel habits have not gone back to normal for her.  No further extreme urgency or accidents.  Again stools are more formed without mucus but still not back to her baseline.  She seen no blood or melena in her stool at any time.  She denies all upper GI and hepatobiliary complaint  Lab work from primary care reviewed, see objective  Past Medical History:  Diagnosis Date   Aspergillus pneumonia (HCC)    Hyperlipidemia    Osteoporosis     Past Surgical History:  Procedure Laterality Date   CATARACT EXTRACTION Bilateral 2018    Outpatient Medications Prior to Visit  Medication Sig Dispense Refill   azelastine (ASTELIN) 0.1 % nasal spray Place 2 sprays into both nostrils daily. Use in each nostril as directed     benzonatate (TESSALON) 100 MG capsule Take by mouth 3 (three) times daily as needed for cough.     calcium carbonate (OSCAL)  1500 (600 Ca) MG TABS tablet Take 1,500 mg by mouth daily with breakfast.     Collagen-Boron-Hyaluronic Acid ([employer] TRIPLE ACTION) 40-5-3.3 MG TABS Take 1 tablet by mouth daily.     Fluticasone Furoate (ARNUITY ELLIPTA) 200 MCG/ACT AEPB Inhale 1 puff into the lungs daily.     montelukast (SINGULAIR) 10 MG tablet Take 10 mg by mouth at bedtime.     albuterol (PROVENTIL) (2.5 MG/3ML) 0.083% nebulizer solution Take 2.5 mg by nebulization every 6 (six) hours as needed for wheezing or shortness of breath.     No facility-administered medications prior to visit.    Allergies  Allergen Reactions   Other Anaphylaxis    Shellfish and Shrimp     Codeine Hives and Itching   Penicillins Hives    Family History  Problem Relation Age of Onset   Colon cancer Neg Hx    Stomach cancer Neg Hx    Esophageal cancer Neg Hx     Social History   Tobacco Use   Smoking status: Never   Smokeless tobacco: Never  Vaping Use   Vaping status: Never Used  Substance Use Topics   Alcohol use: Yes    Comment: occasionaly   Drug use: Never    ROS: As per history of present illness, otherwise negative  BP 122/86   Pulse 81   Ht 4\' 11"  (1.499 m)   Wt 97 lb 2 oz (  44.1 kg)   BMI 19.62 kg/m  Gen: awake, alert, NAD HEENT: anicteric  CV: RRR, no mrg Pulm: CTA b/l Abd: soft, NT/ND, +BS throughout Ext: no c/c/e Neuro: nonfocal  Primary care lab work stool culture negative, C. difficile negative, ova and parasite negative White count 8.82, hemoglobin 14.0, MCV 96.3, platelet count 325 AST 18, ALT 12, alk phos 51, total bilirubin 0.7   ASSESSMENT/PLAN: 82 year old female with a past medical history of hyperlipidemia, osteoporosis, prior aspergillus pneumonia who is seen to evaluate altered bowel habit with abdominal gas and bloating.   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  She has not had alarm symptoms such  as blood in stool, abdominal pain, weight loss or ongoing diarrhea.  I recommended the following: -- Metronidazole 250 mg 3 times daily x 7 days -- Probiotic for an additional month, stop thereafter -- If symptoms fail to resolve in bowel habits normalize completely she is asked to contact me for follow-up -- She is happy with this plan  2.  Colon cancer screening --no family history of colon cancer or personal history of colon polyps.  Her last colonoscopy was in April 2019 and additional screening deferred based on age      Cc:Alysia Penna, Md [address] [place],  [place] [address]

## 2023-06-29 DIAGNOSIS — H26493 Other secondary cataract, bilateral: Secondary | ICD-10-CM | POA: Diagnosis not present

## 2023-06-29 DIAGNOSIS — H04123 Dry eye syndrome of bilateral lacrimal glands: Secondary | ICD-10-CM | POA: Diagnosis not present

## 2023-06-29 DIAGNOSIS — Z961 Presence of intraocular lens: Secondary | ICD-10-CM | POA: Diagnosis not present

## 2023-06-29 DIAGNOSIS — H35371 Puckering of macula, right eye: Secondary | ICD-10-CM | POA: Diagnosis not present

## 2023-06-29 DIAGNOSIS — H5213 Myopia, bilateral: Secondary | ICD-10-CM | POA: Diagnosis not present

## 2023-07-18 DIAGNOSIS — J0100 Acute maxillary sinusitis, unspecified: Secondary | ICD-10-CM | POA: Diagnosis not present

## 2023-07-18 DIAGNOSIS — R0981 Nasal congestion: Secondary | ICD-10-CM | POA: Diagnosis not present

## 2023-08-03 DIAGNOSIS — H26491 Other secondary cataract, right eye: Secondary | ICD-10-CM | POA: Diagnosis not present

## 2023-08-03 DIAGNOSIS — H26492 Other secondary cataract, left eye: Secondary | ICD-10-CM | POA: Diagnosis not present

## 2023-09-29 ENCOUNTER — Encounter: Payer: Self-pay | Admitting: Internal Medicine

## 2023-09-29 ENCOUNTER — Other Ambulatory Visit: Payer: Self-pay | Admitting: Internal Medicine

## 2023-09-29 DIAGNOSIS — Z139 Encounter for screening, unspecified: Secondary | ICD-10-CM

## 2023-10-24 ENCOUNTER — Ambulatory Visit
Admission: RE | Admit: 2023-10-24 | Discharge: 2023-10-24 | Disposition: A | Discharge status: Home / Self Care | Payer: PPO | Source: Ambulatory Visit | Attending: Internal Medicine | Admitting: Internal Medicine

## 2023-10-24 DIAGNOSIS — Z139 Encounter for screening, unspecified: Secondary | ICD-10-CM

## 2023-10-24 DIAGNOSIS — Z1231 Encounter for screening mammogram for malignant neoplasm of breast: Secondary | ICD-10-CM | POA: Diagnosis not present

## 2024-03-05 DIAGNOSIS — M810 Age-related osteoporosis without current pathological fracture: Secondary | ICD-10-CM | POA: Diagnosis not present

## 2024-03-05 DIAGNOSIS — R946 Abnormal results of thyroid function studies: Secondary | ICD-10-CM | POA: Diagnosis not present

## 2024-03-05 DIAGNOSIS — R030 Elevated blood-pressure reading, without diagnosis of hypertension: Secondary | ICD-10-CM | POA: Diagnosis not present

## 2024-03-05 DIAGNOSIS — E785 Hyperlipidemia, unspecified: Secondary | ICD-10-CM | POA: Diagnosis not present

## 2024-03-19 DIAGNOSIS — M810 Age-related osteoporosis without current pathological fracture: Secondary | ICD-10-CM | POA: Diagnosis not present

## 2024-03-19 DIAGNOSIS — W19XXXA Unspecified fall, initial encounter: Secondary | ICD-10-CM | POA: Diagnosis not present

## 2024-03-19 DIAGNOSIS — R030 Elevated blood-pressure reading, without diagnosis of hypertension: Secondary | ICD-10-CM | POA: Diagnosis not present

## 2024-03-19 DIAGNOSIS — R600 Localized edema: Secondary | ICD-10-CM | POA: Diagnosis not present

## 2024-03-19 DIAGNOSIS — L98491 Non-pressure chronic ulcer of skin of other sites limited to breakdown of skin: Secondary | ICD-10-CM | POA: Diagnosis not present

## 2024-03-26 DIAGNOSIS — J45909 Unspecified asthma, uncomplicated: Secondary | ICD-10-CM | POA: Diagnosis not present

## 2024-03-26 DIAGNOSIS — R600 Localized edema: Secondary | ICD-10-CM | POA: Diagnosis not present

## 2024-03-26 DIAGNOSIS — J310 Chronic rhinitis: Secondary | ICD-10-CM | POA: Diagnosis not present

## 2024-03-26 DIAGNOSIS — R030 Elevated blood-pressure reading, without diagnosis of hypertension: Secondary | ICD-10-CM | POA: Diagnosis not present

## 2024-03-26 DIAGNOSIS — Z0000 Encounter for general adult medical examination without abnormal findings: Secondary | ICD-10-CM | POA: Diagnosis not present

## 2024-03-26 DIAGNOSIS — L98491 Non-pressure chronic ulcer of skin of other sites limited to breakdown of skin: Secondary | ICD-10-CM | POA: Diagnosis not present

## 2024-03-26 DIAGNOSIS — E785 Hyperlipidemia, unspecified: Secondary | ICD-10-CM | POA: Diagnosis not present

## 2024-03-26 DIAGNOSIS — Z1331 Encounter for screening for depression: Secondary | ICD-10-CM | POA: Diagnosis not present

## 2024-03-26 DIAGNOSIS — Z1339 Encounter for screening examination for other mental health and behavioral disorders: Secondary | ICD-10-CM | POA: Diagnosis not present

## 2024-03-26 DIAGNOSIS — Z23 Encounter for immunization: Secondary | ICD-10-CM | POA: Diagnosis not present

## 2024-03-26 DIAGNOSIS — M810 Age-related osteoporosis without current pathological fracture: Secondary | ICD-10-CM | POA: Diagnosis not present

## 2024-08-12 DIAGNOSIS — Z961 Presence of intraocular lens: Secondary | ICD-10-CM | POA: Diagnosis not present

## 2024-08-12 DIAGNOSIS — H04123 Dry eye syndrome of bilateral lacrimal glands: Secondary | ICD-10-CM | POA: Diagnosis not present
